# Patient Record
Sex: Male | Born: 1965
Health system: Southern US, Community
[De-identification: ages and names within clinical notes are randomized; demographics above are authoritative.]

## PROBLEM LIST (undated history)

## (undated) DIAGNOSIS — E119 Type 2 diabetes mellitus without complications: Secondary | ICD-10-CM

## (undated) DIAGNOSIS — F32A Depression, unspecified: Secondary | ICD-10-CM

## (undated) DIAGNOSIS — M1990 Unspecified osteoarthritis, unspecified site: Secondary | ICD-10-CM

## (undated) DIAGNOSIS — F419 Anxiety disorder, unspecified: Secondary | ICD-10-CM

## (undated) DIAGNOSIS — M109 Gout, unspecified: Secondary | ICD-10-CM

## (undated) DIAGNOSIS — I10 Essential (primary) hypertension: Secondary | ICD-10-CM

## (undated) DIAGNOSIS — K219 Gastro-esophageal reflux disease without esophagitis: Secondary | ICD-10-CM

## (undated) DIAGNOSIS — F329 Major depressive disorder, single episode, unspecified: Secondary | ICD-10-CM

## (undated) DIAGNOSIS — K5792 Diverticulitis of intestine, part unspecified, without perforation or abscess without bleeding: Secondary | ICD-10-CM

## (undated) DIAGNOSIS — E785 Hyperlipidemia, unspecified: Secondary | ICD-10-CM

## (undated) HISTORY — DX: Essential (primary) hypertension: I10

## (undated) HISTORY — DX: Diverticulitis of intestine, part unspecified, without perforation or abscess without bleeding: K57.92

## (undated) HISTORY — PX: WISDOM TOOTH EXTRACTION: SHX21

## (undated) HISTORY — DX: Anxiety disorder, unspecified: F41.9

## (undated) HISTORY — DX: Gastro-esophageal reflux disease without esophagitis: K21.9

## (undated) HISTORY — DX: Depression, unspecified: F32.A

## (undated) HISTORY — DX: Major depressive disorder, single episode, unspecified: F32.9

## (undated) HISTORY — PX: CHOLECYSTECTOMY: SHX55

## (undated) HISTORY — DX: Hyperlipidemia, unspecified: E78.5

---

## 2010-02-11 DIAGNOSIS — K5792 Diverticulitis of intestine, part unspecified, without perforation or abscess without bleeding: Secondary | ICD-10-CM

## 2010-02-11 HISTORY — DX: Diverticulitis of intestine, part unspecified, without perforation or abscess without bleeding: K57.92

## 2013-02-11 HISTORY — PX: LASIK: SHX215

## 2015-07-06 ENCOUNTER — Encounter: Payer: Self-pay | Admitting: Family Medicine

## 2015-07-06 ENCOUNTER — Ambulatory Visit (INDEPENDENT_AMBULATORY_CARE_PROVIDER_SITE_OTHER): Payer: BLUE CROSS/BLUE SHIELD | Admitting: Family Medicine

## 2015-07-06 ENCOUNTER — Ambulatory Visit (INDEPENDENT_AMBULATORY_CARE_PROVIDER_SITE_OTHER): Payer: BLUE CROSS/BLUE SHIELD

## 2015-07-06 VITALS — BP 140/86 | HR 80 | Temp 98.5°F | Ht 70.0 in | Wt 202.8 lb

## 2015-07-06 DIAGNOSIS — R030 Elevated blood-pressure reading, without diagnosis of hypertension: Secondary | ICD-10-CM | POA: Diagnosis not present

## 2015-07-06 DIAGNOSIS — K219 Gastro-esophageal reflux disease without esophagitis: Secondary | ICD-10-CM | POA: Diagnosis not present

## 2015-07-06 DIAGNOSIS — R079 Chest pain, unspecified: Secondary | ICD-10-CM | POA: Diagnosis not present

## 2015-07-06 DIAGNOSIS — K921 Melena: Secondary | ICD-10-CM

## 2015-07-06 LAB — BAYER DCA HB A1C WAIVED: HB A1C: 5.1 % (ref <7.0)

## 2015-07-06 NOTE — Progress Notes (Addendum)
   HPI  Patient presents today here for chest tightness.  Patient explains that over the last 2 months or so he's had intermittent almost daily left-sided chest tightness. It radiates into his left shoulder and in the left scapula Described as a tightness type pain.  it seems to get worse when he gets more short of breath, he gets more short of breath when he wears a respirator at work.  The pain comes on and lasts hours at a time, it does seem to get worse when he begins to rest. It is not exertional He has no palpitations or leg edema  He works in a [employer].  He is not a smoker, his 2 uncles both had heart attacks, however his father who is 70 years old and his mother has not ever had a heart attack. He has no siblings.  His wife died about 10 months ago from a heart attack.  He also complains of intermittent bloody stools over the last year, these happen a few times a week, however has not happened in the last week. No rectal pain  PMH: Smoking status noted ROS: Per HPI  Objective: BP 161/107 mmHg  Pulse 80  Temp(Src) 98.5 F (36.9 C) (Oral)  Ht '5\' 10"'$  (1.778 m)  Wt 202 lb 12.8 oz (91.989 kg)  BMI 29.10 kg/m2 Gen: NAD, alert, cooperative with exam HEENT: NCAT, nares clear, oropharynx clear CV: RRR, good S1/S2, no murmur Chest wall No tenderness to palpation Resp: CTABL, no wheezes, non-labored Abd: SNTND, BS present, no guarding or organomegaly Ext: No edema, warm Neuro: Alert and oriented, No gross deficits  EKG - NSR CXR - No acute findings  Assessment and plan:  # Chest tightness, chest pain Unlikely cardiac in origin, does have features of typical and atypical chest pain EKG today NSR Chest x-ray normal Labs for risk stratification Likely schedule exercise stress test for clinic   # elevated blood pressure without diagnosis of hypertension Elevated today Came down some after sitting for a while  Gerd Has daily symptoms, distinctly different than  the chest tightness described above Does well with omeprazole     Orders Placed This Encounter  Procedures  . DG Chest 2 View    Standing Status: Future     Number of Occurrences:      Standing Expiration Date: 09/04/2016    Order Specific Question:  Reason for Exam (SYMPTOM  OR DIAGNOSIS REQUIRED)    Answer:  chest pain X 2 months, mild dyspnea    Order Specific Question:  Preferred imaging location?    Answer:  External  . Lipid Panel  . CMP14+EGFR  . CBC with Differential  . TSH  . Bayer DCA Hb A1c Waived  . Ambulatory referral to Gastroenterology    Referral Priority:  Routine    Referral Type:  Consultation    Referral Reason:  Specialty Services Required    Number of Visits Requested:  1  . EKG 12-Lead    Laroy Apple, MD [facility] 07/06/2015, 1:53 PM

## 2015-07-06 NOTE — Patient Instructions (Signed)
Great to meet you!  We will call with lab results and chest x ray results within 1 week  Lets follow up in 1 month, unless you need [place] sooner.     Chest Pain  Chest pain can be caused by many different conditions. There is always a chance that your pain could be related to something serious, such as a heart attack or a blood clot in your lungs. Chest pain can also be caused by conditions that are not life-threatening. If you have chest pain, it is very important to follow up with your health care provider. CAUSES  Chest pain can be caused by:  Heartburn.  Pneumonia or bronchitis.  Anxiety or stress.  Inflammation around your heart (pericarditis) or lung (pleuritis or pleurisy).  A blood clot in your lung.  A collapsed lung (pneumothorax). It can develop suddenly on its own (spontaneous pneumothorax) or from trauma to the chest.  Shingles infection (varicella-zoster virus).  Heart attack.  Damage to the bones, muscles, and cartilage that make up your chest wall. This can include:  Bruised bones due to injury.  Strained muscles or cartilage due to frequent or repeated coughing or overwork.  Fracture to one or more ribs.  Sore cartilage due to inflammation (costochondritis). RISK FACTORS  Risk factors for chest pain may include:  Activities that increase your risk for trauma or injury to your chest.  Respiratory infections or conditions that cause frequent coughing.  Medical conditions or overeating that can cause heartburn.  Heart disease or family history of heart disease.  Conditions or health behaviors that increase your risk of developing a blood clot.  Having had chicken pox (varicella zoster). SIGNS AND SYMPTOMS Chest pain can feel like:  Burning or tingling on the surface of your chest or deep in your chest.  Crushing, pressure, aching, or squeezing pain.  Dull or sharp pain that is worse when you move, cough, or take a deep breath.  Pain that is also  felt in your back, neck, shoulder, or arm, or pain that spreads to any of these areas. Your chest pain may come and go, or it may stay constant. DIAGNOSIS Lab tests or other studies may be needed to find the cause of your pain. Your health care provider may have you take a test called an ambulatory ECG (electrocardiogram). An ECG records your heartbeat patterns at the time the test is performed. You may also have other tests, such as:  Transthoracic echocardiogram (TTE). During echocardiography, sound waves are used to create a picture of all of the heart structures and to look at how blood flows through your heart.  Transesophageal echocardiogram (TEE).This is a more advanced imaging test that obtains images from inside your body. It allows your health care provider to see your heart in finer detail.  Cardiac monitoring. This allows your health care provider to monitor your heart rate and rhythm in real time.  Holter monitor. This is a portable device that records your heartbeat and can help to diagnose abnormal heartbeats. It allows your health care provider to track your heart activity for several days, if needed.  Stress tests. These can be done through exercise or by taking medicine that makes your heart beat more quickly.  Blood tests.  Imaging tests. TREATMENT  Your treatment depends on what is causing your chest pain. Treatment may include:  Medicines. These may include:  Acid blockers for heartburn.  Anti-inflammatory medicine.  Pain medicine for inflammatory conditions.  Antibiotic medicine, if an  infection is present.  Medicines to dissolve blood clots.  Medicines to treat coronary artery disease.  Supportive care for conditions that do not require medicines. This may include:  Resting.  Applying heat or cold packs to injured areas.  Limiting activities until pain decreases. HOME CARE INSTRUCTIONS  If you were prescribed an antibiotic medicine, finish it all  even if you start to feel better.  Avoid any activities that bring on chest pain.  Do not use any tobacco products, including cigarettes, chewing tobacco, or electronic cigarettes. If you need help quitting, ask your health care provider.  Do not drink alcohol.  Take medicines only as directed by your health care provider.  Keep all follow-up visits as directed by your health care provider. This is important. This includes any further testing if your chest pain does not go away.  If heartburn is the cause for your chest pain, you may be told to keep your head raised (elevated) while sleeping. This reduces the chance that acid will go from your stomach into your esophagus.  Make lifestyle changes as directed by your health care provider. These may include:  Getting regular exercise. Ask your health care provider to suggest some activities that are safe for you.  Eating a heart-healthy diet. A registered dietitian can help you to learn healthy eating options.  Maintaining a healthy weight.  Managing diabetes, if necessary.  Reducing stress. SEEK MEDICAL CARE IF:  Your chest pain does not go away after treatment.  You have a rash with blisters on your chest.  You have a fever. SEEK IMMEDIATE MEDICAL CARE IF:   Your chest pain is worse.  You have an increasing cough, or you cough up blood.  You have severe abdominal pain.  You have severe weakness.  You faint.  You have chills.  You have sudden, unexplained chest discomfort.  You have sudden, unexplained discomfort in your arms, back, neck, or jaw.  You have shortness of breath at any time.  You suddenly start to sweat, or your skin gets clammy.  You feel nauseous or you vomit.  You suddenly feel light-headed or dizzy.  Your heart begins to beat quickly, or it feels like it is skipping beats. These symptoms may represent a serious problem that is an emergency. Do not wait to see if the symptoms will go away. Get  medical help right away. Call your local emergency services (911 in the [place] Do not drive yourself to the hospital.   This information is not intended to replace advice given to you by your health care provider. Make sure you discuss any questions you have with your health care provider.   Document Released: 11/07/2004 Document Revised: 02/18/2014 Document Reviewed: 09/03/2013 Elsevier Interactive Patient Education [employer].

## 2015-07-07 LAB — CMP14+EGFR
ALT: 25 IU/L (ref 0–44)
AST: 23 IU/L (ref 0–40)
Albumin/Globulin Ratio: 1.6 (ref 1.2–2.2)
Albumin: 4.6 g/dL (ref 3.5–5.5)
Alkaline Phosphatase: 90 IU/L (ref 39–117)
BUN/Creatinine Ratio: 17 (ref 9–20)
BUN: 19 mg/dL (ref 6–24)
Bilirubin Total: 0.5 mg/dL (ref 0.0–1.2)
CALCIUM: 9.7 mg/dL (ref 8.7–10.2)
CO2: 22 mmol/L (ref 18–29)
CREATININE: 1.1 mg/dL (ref 0.76–1.27)
Chloride: 98 mmol/L (ref 96–106)
GFR calc Af Amer: 89 mL/min/{1.73_m2} (ref >59)
GFR, EST NON AFRICAN AMERICAN: 77 mL/min/{1.73_m2} (ref >59)
GLOBULIN, TOTAL: 2.8 g/dL (ref 1.5–4.5)
Glucose: 137 mg/dL — ABNORMAL HIGH (ref 65–99)
Potassium: 4.2 mmol/L (ref 3.5–5.2)
Sodium: 137 mmol/L (ref 134–144)
TOTAL PROTEIN: 7.4 g/dL (ref 6.0–8.5)

## 2015-07-07 LAB — CBC WITH DIFFERENTIAL/PLATELET
BASOS: 0 %
Basophils Absolute: 0 10*3/uL (ref 0.0–0.2)
EOS (ABSOLUTE): 0.2 10*3/uL (ref 0.0–0.4)
EOS: 2 %
HEMATOCRIT: 46.1 % (ref 37.5–51.0)
HEMOGLOBIN: 15.6 g/dL (ref 12.6–17.7)
IMMATURE GRANS (ABS): 0 10*3/uL (ref 0.0–0.1)
IMMATURE GRANULOCYTES: 0 %
LYMPHS: 23 %
Lymphocytes Absolute: 1.8 10*3/uL (ref 0.7–3.1)
MCH: 32.3 pg (ref 26.6–33.0)
MCHC: 33.8 g/dL (ref 31.5–35.7)
MCV: 95 fL (ref 79–97)
MONOCYTES: 8 %
Monocytes Absolute: 0.6 10*3/uL (ref 0.1–0.9)
NEUTROS PCT: 67 %
Neutrophils Absolute: 5.2 10*3/uL (ref 1.4–7.0)
Platelets: 217 10*3/uL (ref 150–379)
RBC: 4.83 x10E6/uL (ref 4.14–5.80)
RDW: 13.2 % (ref 12.3–15.4)
WBC: 7.8 10*3/uL (ref 3.4–10.8)

## 2015-07-07 LAB — TSH: TSH: 1.64 u[IU]/mL (ref 0.450–4.500)

## 2015-07-07 LAB — LIPID PANEL
CHOL/HDL RATIO: 3.6 ratio (ref 0.0–5.0)
CHOLESTEROL TOTAL: 187 mg/dL (ref 100–199)
HDL: 52 mg/dL (ref >39)
LDL CALC: 84 mg/dL (ref 0–99)
Triglycerides: 253 mg/dL — ABNORMAL HIGH (ref 0–149)
VLDL Cholesterol Cal: 51 mg/dL — ABNORMAL HIGH (ref 5–40)

## 2015-07-11 ENCOUNTER — Ambulatory Visit (INDEPENDENT_AMBULATORY_CARE_PROVIDER_SITE_OTHER): Payer: BLUE CROSS/BLUE SHIELD | Admitting: Family

## 2015-07-11 ENCOUNTER — Ambulatory Visit: Payer: BLUE CROSS/BLUE SHIELD | Admitting: Nurse Practitioner

## 2015-07-11 ENCOUNTER — Encounter: Payer: Self-pay | Admitting: Family

## 2015-07-11 VITALS — BP 169/112 | HR 78 | Temp 98.2°F | Ht 70.0 in | Wt 198.6 lb

## 2015-07-11 DIAGNOSIS — E1159 Type 2 diabetes mellitus with other circulatory complications: Secondary | ICD-10-CM | POA: Insufficient documentation

## 2015-07-11 DIAGNOSIS — I10 Essential (primary) hypertension: Secondary | ICD-10-CM | POA: Diagnosis not present

## 2015-07-11 DIAGNOSIS — E663 Overweight: Secondary | ICD-10-CM | POA: Diagnosis not present

## 2015-07-11 MED ORDER — LISINOPRIL-HYDROCHLOROTHIAZIDE 20-12.5 MG PO TABS: 1.0000 | ORAL_TABLET | Freq: Every day | ORAL | Status: DC

## 2015-07-11 NOTE — Patient Instructions (Signed)
DASH Eating Plan °DASH stands for "Dietary Approaches to Stop Hypertension." The DASH eating plan is a healthy eating plan that has been shown to reduce high blood pressure (hypertension). Additional health benefits may include reducing the risk of type 2 diabetes mellitus, heart disease, and stroke. The DASH eating plan may also help with weight loss. °WHAT DO I NEED TO KNOW ABOUT THE DASH EATING PLAN? °For the DASH eating plan, you will follow these general guidelines: °· Choose foods with a percent daily value for sodium of less than 5% (as listed on the food label). °· Use salt-free seasonings or herbs instead of table salt or sea salt. °· Check with your health care provider or pharmacist before using salt substitutes. °· Eat lower-sodium products, often labeled as "lower sodium" or "no salt added." °· Eat fresh foods. °· Eat more vegetables, fruits, and low-fat dairy products. °· Choose whole grains. Look for the word "whole" as the first word in the ingredient list. °· Choose fish and skinless chicken or turkey more often than red meat. Limit fish, poultry, and meat to 6 oz (170 g) each day. °· Limit sweets, desserts, sugars, and sugary drinks. °· Choose heart-healthy fats. °· Limit cheese to 1 oz (28 g) per day. °· Eat more home-cooked food and less restaurant, buffet, and fast food. °· Limit fried foods. °· Cook foods using methods other than frying. °· Limit canned vegetables. If you do use them, rinse them well to decrease the sodium. °· When eating at a restaurant, ask that your food be prepared with less salt, or no salt if possible. °WHAT FOODS CAN I EAT? °Seek help from a dietitian for individual calorie needs. °Grains °Whole grain or whole wheat bread. Brown rice. Whole grain or whole wheat pasta. Quinoa, bulgur, and whole grain cereals. Low-sodium cereals. Corn or whole wheat flour tortillas. Whole grain cornbread. Whole grain crackers. Low-sodium crackers. °Vegetables °Fresh or frozen vegetables  (raw, steamed, roasted, or grilled). Low-sodium or reduced-sodium tomato and vegetable juices. Low-sodium or reduced-sodium tomato sauce and paste. Low-sodium or reduced-sodium canned vegetables.  °Fruits °All fresh, canned (in natural juice), or frozen fruits. °Meat and Other Protein Products °Ground beef (85% or leaner), grass-fed beef, or beef trimmed of fat. Skinless chicken or turkey. Ground chicken or turkey. Pork trimmed of fat. All fish and seafood. Eggs. Dried beans, peas, or lentils. Unsalted nuts and seeds. Unsalted canned beans. °Dairy °Low-fat dairy products, such as skim or 1% milk, 2% or reduced-fat cheeses, low-fat ricotta or cottage cheese, or plain low-fat yogurt. Low-sodium or reduced-sodium cheeses. °Fats and Oils °Tub margarines without trans fats. Light or reduced-fat mayonnaise and salad dressings (reduced sodium). Avocado. Safflower, olive, or canola oils. Natural peanut or almond butter. °Other °Unsalted popcorn and pretzels. °The items listed above may not be a complete list of recommended foods or beverages. Contact your dietitian for more options. °WHAT FOODS ARE NOT RECOMMENDED? °Grains °White bread. White pasta. White rice. Refined cornbread. Bagels and croissants. Crackers that contain trans fat. °Vegetables °Creamed or fried vegetables. Vegetables in a cheese sauce. Regular canned vegetables. Regular canned tomato sauce and paste. Regular tomato and vegetable juices. °Fruits °Dried fruits. Canned fruit in light or heavy syrup. Fruit juice. °Meat and Other Protein Products °Fatty cuts of meat. Ribs, chicken wings, bacon, sausage, bologna, salami, chitterlings, fatback, hot dogs, bratwurst, and packaged luncheon meats. Salted nuts and seeds. Canned beans with salt. °Dairy °Whole or 2% milk, cream, half-and-half, and cream cheese. Whole-fat or sweetened yogurt. Full-fat   cheeses or blue cheese. Nondairy creamers and whipped toppings. Processed cheese, cheese spreads, or cheese  curds. °Condiments °Onion and garlic salt, seasoned salt, table salt, and sea salt. Canned and packaged gravies. Worcestershire sauce. Tartar sauce. Barbecue sauce. Teriyaki sauce. Soy sauce, including reduced sodium. Steak sauce. Fish sauce. Oyster sauce. Cocktail sauce. Horseradish. Ketchup and mustard. Meat flavorings and tenderizers. Bouillon cubes. Hot sauce. Tabasco sauce. Marinades. Taco seasonings. Relishes. °Fats and Oils °Butter, stick margarine, lard, shortening, ghee, and bacon fat. Coconut, palm kernel, or palm oils. Regular salad dressings. °Other °Pickles and olives. Salted popcorn and pretzels. °The items listed above may not be a complete list of foods and beverages to avoid. Contact your dietitian for more information. °WHERE CAN I FIND MORE INFORMATION? °National Heart, [employer], [facility] [employer] Institute: www.nhlbi.nih.gov/health/health-topics/topics/dash/ °  °This information is not intended to replace advice given to you by your health care provider. Make sure you discuss any questions you have with your health care provider. °  °Document Released: 01/17/2011 Document Revised: 02/18/2014 Document Reviewed: 12/02/2012 °Elsevier Interactive Patient Education ©2016 Elsevier Inc. ° °Hypertension °Hypertension, commonly called high blood pressure, is when the force of blood pumping through your arteries is too strong. Your arteries are the blood vessels that carry blood from your heart throughout your body. A blood pressure reading consists of a higher number over a lower number, such as 110/72. The higher number (systolic) is the pressure inside your arteries when your heart pumps. The lower number (diastolic) is the pressure inside your arteries when your heart relaxes. Ideally you want your blood pressure below 120/80. °Hypertension forces your heart to work harder to pump blood. Your arteries may become narrow or stiff. Having untreated or uncontrolled hypertension can cause heart attack, stroke, kidney  disease, and other problems. °RISK FACTORS °Some risk factors for high blood pressure are controllable. Others are not.  °Risk factors you cannot control include:  °· Race. You may be at higher risk if you are African American. °· Age. Risk increases with age. °· Gender. Men are at higher risk than women before age 45 years. After age 65, women are at higher risk than men. °Risk factors you can control include: °· Not getting enough exercise or physical activity. °· Being overweight. °· Getting too much fat, sugar, calories, or salt in your diet. °· Drinking too much alcohol. °SIGNS AND SYMPTOMS °Hypertension does not usually cause signs or symptoms. Extremely high blood pressure (hypertensive crisis) may cause headache, anxiety, shortness of breath, and nosebleed. °DIAGNOSIS °To check if you have hypertension, your health care provider will measure your blood pressure while you are seated, with your arm held at the level of your heart. It should be measured at least twice using the same arm. Certain conditions can cause a difference in blood pressure between your right and left arms. A blood pressure reading that is higher than normal on one occasion does not mean that you need treatment. If it is not clear whether you have high blood pressure, you may be asked to return on a different day to have your blood pressure checked again. Or, you may be asked to monitor your blood pressure at home for 1 or more weeks. °TREATMENT °Treating high blood pressure includes making lifestyle changes and possibly taking medicine. Living a healthy lifestyle can help lower high blood pressure. You may need to change some of your habits. °Lifestyle changes may include: °· Following the DASH diet. This diet is high in fruits, vegetables, and whole   grains. It is low in salt, red meat, and added sugars. °· Keep your sodium intake below 2,300 mg per day. °· Getting at least 30-45 minutes of aerobic exercise at least 4 times per  week. °· Losing weight if necessary. °· Not smoking. °· Limiting alcoholic beverages. °· Learning ways to reduce stress. °Your health care provider may prescribe medicine if lifestyle changes are not enough to get your blood pressure under control, and if one of the following is true: °· You are 18-59 years of age and your systolic blood pressure is above 140. °· You are 60 years of age or older, and your systolic blood pressure is above 150. °· Your diastolic blood pressure is above 90. °· You have diabetes, and your systolic blood pressure is over 140 or your diastolic blood pressure is over 90. °· You have kidney disease and your blood pressure is above 140/90. °· You have heart disease and your blood pressure is above 140/90. °Your personal target blood pressure may vary depending on your medical conditions, your age, and other factors. °HOME CARE INSTRUCTIONS °· Have your blood pressure rechecked as directed by your health care provider.   °· Take medicines only as directed by your health care provider. Follow the directions carefully. Blood pressure medicines must be taken as prescribed. The medicine does not work as well when you skip doses. Skipping doses also puts you at risk for problems. °· Do not smoke.   °· Monitor your blood pressure at home as directed by your health care provider.  °SEEK MEDICAL CARE IF:  °· You think you are having a reaction to medicines taken. °· You have recurrent headaches or feel dizzy. °· You have swelling in your ankles. °· You have trouble with your vision. °SEEK IMMEDIATE MEDICAL CARE IF: °· You develop a severe headache or confusion. °· You have unusual weakness, numbness, or feel faint. °· You have severe chest or abdominal pain. °· You vomit repeatedly. °· You have trouble breathing. °MAKE SURE YOU:  °· Understand these instructions. °· Will watch your condition. °· Will get help right away if you are not doing well or get worse. °  °This information is not intended to  replace advice given to you by your health care provider. Make sure you discuss any questions you have with your health care provider. °  °Document Released: 01/28/2005 Document Revised: 06/14/2014 Document Reviewed: 11/20/2012 °Elsevier Interactive Patient Education ©2016 Elsevier Inc. ° °

## 2015-07-11 NOTE — Progress Notes (Signed)
   Subjective:    Patient ID: Alejandro Rivera, male    DOB: 1965/10/06, 50 y.o.   MRN: [id number]  HPI    Review of Systems     Objective:   Physical Exam        Assessment & Plan:

## 2015-07-11 NOTE — Progress Notes (Signed)
   Subjective:    Patient ID: Alejandro Rivera, male    DOB: 07-08-1965, 50 y.o.   MRN: [id number]  Pt presents to the office for recurrent HTN and chest pain. Pt was seen on 07/06/15 and had negative EKG, chest x-ray and stable lab work.  Hypertension This is a new problem. The current episode started more than 1 year ago. The problem has been gradually worsening since onset. The problem is uncontrolled. Associated symptoms include chest pain and palpitations. Pertinent negatives include no anxiety, blurred vision, headaches, peripheral edema, shortness of breath or sweats. Risk factors for coronary artery disease include family history. Past treatments include nothing. The current treatment provides no improvement. There is no history of kidney disease, CAD/MI, CVA, heart failure or a thyroid problem. There is no history of sleep apnea.  Chest Pain  Associated symptoms include palpitations. Pertinent negatives include no headaches or shortness of breath.  His past medical history is significant for hypertension.  Pertinent negatives for past medical history include no sleep apnea and no thyroid problem.      Review of Systems  Eyes: Negative for blurred vision.  Respiratory: Negative for shortness of breath.   Cardiovascular: Positive for chest pain and palpitations.  Neurological: Negative for headaches.  All other systems reviewed and are negative.      Objective:   Physical Exam  Constitutional: He is oriented to person, place, and time. He appears well-developed and well-nourished. No distress.  HENT:  Head: Normocephalic.  Eyes: Pupils are equal, round, and reactive to light. Right eye exhibits no discharge. Left eye exhibits no discharge.  Neck: Normal range of motion. Neck supple. No thyromegaly present.  Cardiovascular: Normal rate, regular rhythm, normal heart sounds and intact distal pulses.   No murmur heard. Pulmonary/Chest: Effort normal and breath sounds normal. No  respiratory distress. He has no wheezes.  Abdominal: Soft. Bowel sounds are normal. He exhibits no distension. There is no tenderness.  Musculoskeletal: Normal range of motion. He exhibits no edema or tenderness.  Neurological: He is alert and oriented to person, place, and time. He has normal reflexes. No cranial nerve deficit.  Skin: Skin is warm and dry. No rash noted. No erythema.  Psychiatric: He has a normal mood and affect. His behavior is normal. Judgment and thought content normal.  Vitals reviewed.   BP 169/112 mmHg  Pulse 78  Temp(Src) 98.2 F (36.8 C) (Oral)  Ht 5\' 10"  (1.778 m)  Wt 198 lb 9.6 oz (90.084 kg)  BMI 28.50 kg/m2       Assessment & Plan:  1. Essential hypertension -PT started on Zestoretic 20-12.5 mg today -Daily blood pressure log given with instructions on how to fill out and told to bring to next visit -Dash diet information given -Exercise encouraged - Stress Management  -Continue current meds -RTO in 1 week - lisinopril-hydrochlorothiazide (ZESTORETIC) 20-12.5 MG tablet; Take 1 tablet by mouth daily.  Dispense: 90 tablet; Refill: 3  2. Overweight (BMI 25.0-29.9)   Evelina Dun, FNP

## 2015-07-18 ENCOUNTER — Encounter: Payer: Self-pay | Admitting: Family

## 2015-07-18 ENCOUNTER — Ambulatory Visit (INDEPENDENT_AMBULATORY_CARE_PROVIDER_SITE_OTHER): Payer: BLUE CROSS/BLUE SHIELD | Admitting: Family

## 2015-07-18 VITALS — BP 127/86 | HR 66 | Temp 98.0°F | Ht 70.0 in | Wt 202.8 lb

## 2015-07-18 DIAGNOSIS — I10 Essential (primary) hypertension: Secondary | ICD-10-CM | POA: Diagnosis not present

## 2015-07-18 DIAGNOSIS — Z114 Encounter for screening for human immunodeficiency virus [HIV]: Secondary | ICD-10-CM | POA: Diagnosis not present

## 2015-07-18 NOTE — Progress Notes (Signed)
   Subjective:    Patient ID: Alejandro Rivera, male    DOB: [birth year], 50 y.o.   MRN: [id number]  Pt presents to the office today to recheck HTN. PT's BP is not at goal today. Hypertension This is a chronic problem. The current episode started more than 1 year ago. The problem has been resolved since onset. The problem is controlled. Associated symptoms include anxiety. Pertinent negatives include no headaches, palpitations, peripheral edema or shortness of breath. Risk factors for coronary artery disease include family history, obesity, male gender and stress. Past treatments include diuretics and ACE inhibitors. The current treatment provides moderate improvement. There is no history of kidney disease, CAD/MI, CVA, heart failure or a thyroid problem. There is no history of sleep apnea.      Review of Systems  Constitutional: Negative.   HENT: Negative.   Respiratory: Negative.  Negative for shortness of breath.   Cardiovascular: Negative.  Negative for palpitations.  Gastrointestinal: Negative.   Endocrine: Negative.   Genitourinary: Negative.   Musculoskeletal: Negative.   Neurological: Negative.  Negative for headaches.  Hematological: Negative.   Psychiatric/Behavioral: Negative.   All other systems reviewed and are negative.      Objective:   Physical Exam  Constitutional: He is oriented to person, place, and time. He appears well-developed and well-nourished. No distress.  HENT:  Head: Normocephalic.  Right Ear: External ear normal.  Left Ear: External ear normal.  Nose: Nose normal.  Mouth/Throat: Oropharynx is clear and moist.  Eyes: Pupils are equal, round, and reactive to light. Right eye exhibits no discharge. Left eye exhibits no discharge.  Neck: Normal range of motion. Neck supple. No thyromegaly present.  Cardiovascular: Normal rate, regular rhythm, normal heart sounds and intact distal pulses.   No murmur heard. Pulmonary/Chest: Effort normal and breath sounds  normal. No respiratory distress. He has no wheezes.  Abdominal: Soft. Bowel sounds are normal. He exhibits no distension. There is no tenderness.  Musculoskeletal: Normal range of motion. He exhibits no edema or tenderness.  Neurological: He is alert and oriented to person, place, and time. He has normal reflexes. No cranial nerve deficit.  Skin: Skin is warm and dry. No rash noted. No erythema.  Psychiatric: He has a normal mood and affect. His behavior is normal. Judgment and thought content normal.  Vitals reviewed.   BP 127/86 mmHg  Pulse 66  Temp(Src) 98 F (36.7 C) (Oral)  Ht '5\' 10"'$  (1.778 m)  Wt 202 lb 12.8 oz (91.989 kg)  BMI 29.10 kg/m2      Assessment & Plan:  1. Essential hypertension -Dash diet information given -Exercise encouraged - Stress Management  -Continue current meds - BMP8+EGFR  2. Screening for HIV (human immunodeficiency virus) - BMP8+EGFR - HIV antibody   Continue all meds Labs pending Health Maintenance reviewed Diet and exercise encouraged RTO as needed and keep appt with PCP  Evelina Dun, FNP

## 2015-07-18 NOTE — Patient Instructions (Signed)
Hypertension Hypertension, commonly called high blood pressure, is when the force of blood pumping through your arteries is too strong. Your arteries are the blood vessels that carry blood from your heart throughout your body. A blood pressure reading consists of a higher number over a lower number, such as 110/72. The higher number (systolic) is the pressure inside your arteries when your heart pumps. The lower number (diastolic) is the pressure inside your arteries when your heart relaxes. Ideally you want your blood pressure below 120/80. Hypertension forces your heart to work harder to pump blood. Your arteries may become narrow or stiff. Having untreated or uncontrolled hypertension can cause heart attack, stroke, kidney disease, and other problems. RISK FACTORS Some risk factors for high blood pressure are controllable. Others are not.  Risk factors you cannot control include:   Race. You may be at higher risk if you are African American.  Age. Risk increases with age.  Gender. Men are at higher risk than women before age 45 years. After age 65, women are at higher risk than men. Risk factors you can control include:  Not getting enough exercise or physical activity.  Being overweight.  Getting too much fat, sugar, calories, or salt in your diet.  Drinking too much alcohol. SIGNS AND SYMPTOMS Hypertension does not usually cause signs or symptoms. Extremely high blood pressure (hypertensive crisis) may cause headache, anxiety, shortness of breath, and nosebleed. DIAGNOSIS To check if you have hypertension, your health care provider will measure your blood pressure while you are seated, with your arm held at the level of your heart. It should be measured at least twice using the same arm. Certain conditions can cause a difference in blood pressure between your right and left arms. A blood pressure reading that is higher than normal on one occasion does not mean that you need treatment. If  it is not clear whether you have high blood pressure, you may be asked to return on a different day to have your blood pressure checked again. Or, you may be asked to monitor your blood pressure at home for 1 or more weeks. TREATMENT Treating high blood pressure includes making lifestyle changes and possibly taking medicine. Living a healthy lifestyle can help lower high blood pressure. You may need to change some of your habits. Lifestyle changes may include:  Following the DASH diet. This diet is high in fruits, vegetables, and whole grains. It is low in salt, red meat, and added sugars.  Keep your sodium intake below 2,300 mg per day.  Getting at least 30-45 minutes of aerobic exercise at least 4 times per week.  Losing weight if necessary.  Not smoking.  Limiting alcoholic beverages.  Learning ways to reduce stress. Your health care provider may prescribe medicine if lifestyle changes are not enough to get your blood pressure under control, and if one of the following is true:  You are 18-59 years of age and your systolic blood pressure is above 140.  You are 60 years of age or older, and your systolic blood pressure is above 150.  Your diastolic blood pressure is above 90.  You have diabetes, and your systolic blood pressure is over 140 or your diastolic blood pressure is over 90.  You have kidney disease and your blood pressure is above 140/90.  You have heart disease and your blood pressure is above 140/90. Your personal target blood pressure may vary depending on your medical conditions, your age, and other factors. HOME CARE INSTRUCTIONS    Have your blood pressure rechecked as directed by your health care provider.   Take medicines only as directed by your health care provider. Follow the directions carefully. Blood pressure medicines must be taken as prescribed. The medicine does not work as well when you skip doses. Skipping doses also puts you at risk for  problems.  Do not smoke.   Monitor your blood pressure at home as directed by your health care provider. SEEK MEDICAL CARE IF:   You think you are having a reaction to medicines taken.  You have recurrent headaches or feel dizzy.  You have swelling in your ankles.  You have trouble with your vision. SEEK IMMEDIATE MEDICAL CARE IF:  You develop a severe headache or confusion.  You have unusual weakness, numbness, or feel faint.  You have severe chest or abdominal pain.  You vomit repeatedly.  You have trouble breathing. MAKE SURE YOU:   Understand these instructions.  Will watch your condition.  Will get help right away if you are not doing well or get worse.   This information is not intended to replace advice given to you by your health care provider. Make sure you discuss any questions you have with your health care provider.   Document Released: 01/28/2005 Document Revised: 06/14/2014 Document Reviewed: 11/20/2012 Elsevier Interactive Patient Education 2016 Elsevier Inc.  

## 2015-07-19 LAB — HIV ANTIBODY (ROUTINE TESTING W REFLEX): HIV SCREEN 4TH GENERATION: NONREACTIVE

## 2015-07-19 LAB — BMP8+EGFR
BUN/Creatinine Ratio: 19 (ref 9–20)
BUN: 16 mg/dL (ref 6–24)
CALCIUM: 10.1 mg/dL (ref 8.7–10.2)
CO2: 25 mmol/L (ref 18–29)
CREATININE: 0.86 mg/dL (ref 0.76–1.27)
Chloride: 97 mmol/L (ref 96–106)
GFR calc Af Amer: 116 mL/min/{1.73_m2} (ref >59)
GFR, EST NON AFRICAN AMERICAN: 100 mL/min/{1.73_m2} (ref >59)
GLUCOSE: 90 mg/dL (ref 65–99)
Potassium: 4.2 mmol/L (ref 3.5–5.2)
Sodium: 139 mmol/L (ref 134–144)

## 2015-08-08 ENCOUNTER — Ambulatory Visit: Payer: BLUE CROSS/BLUE SHIELD | Admitting: Family Medicine

## 2015-08-18 ENCOUNTER — Ambulatory Visit (INDEPENDENT_AMBULATORY_CARE_PROVIDER_SITE_OTHER): Payer: BLUE CROSS/BLUE SHIELD | Admitting: Family Medicine

## 2015-08-18 ENCOUNTER — Encounter: Payer: Self-pay | Admitting: Family Medicine

## 2015-08-18 VITALS — BP 134/83 | HR 81 | Temp 98.3°F | Ht 70.0 in | Wt 203.2 lb

## 2015-08-18 DIAGNOSIS — I10 Essential (primary) hypertension: Secondary | ICD-10-CM | POA: Diagnosis not present

## 2015-08-18 DIAGNOSIS — R0789 Other chest pain: Secondary | ICD-10-CM | POA: Diagnosis not present

## 2015-08-18 DIAGNOSIS — F411 Generalized anxiety disorder: Secondary | ICD-10-CM

## 2015-08-18 MED ORDER — CITALOPRAM HYDROBROMIDE 20 MG PO TABS: 20.0000 mg | ORAL_TABLET | Freq: Every day | ORAL | Status: DC

## 2015-08-18 NOTE — Progress Notes (Signed)
   HPI  Patient presents today here for follow up of chest pain and HTN, also to discuss anxiety  Pt states that his chest pain is L sided tightness type pain lasting 4 to 30 minutes happening a few times a day. It radiates to the L shoulder blade.   It happens at rest and with exertion, it is not associated with sweating, dyspnea, palps, or faint feeling.   He also has some feeling of not being able to catch his breath in th emiddle of the night, he does not have orthopnea.    Anxiety Has been going on since his wifes death, his girlfriend is here and states that he is only sleeps a few hours a night and is under much more stress and more anxious than he realizes.   HTN Measures 130-140 most of the time at home  no side effects form meds Feels better overall  PMH: Smoking status noted ROS: Per HPI  Objective: BP 134/83 mmHg  Pulse 81  Temp(Src) 98.3 F (36.8 C) (Oral)  Ht 5\' 10"  (1.778 m)  Wt 203 lb 3.2 oz (92.171 kg)  BMI 29.16 kg/m2 Gen: NAD, alert, cooperative with exam HEENT: NCAT CV: RRR, good S1/S2, no murmur Chest wall- no tenderness to palp Resp: CTABL, no wheezes, non-labored Ext: No edema, warm Neuro: Alert and oriented, No gross deficits  Assessment and plan:  # atypical chest pain Features of typical and atypical Labs are low risk, EKG last visit reassuring After discussion I think anxiety is most likely culprit  With PND consider TTE  # Anxiety New problem Start celexa 10 mg X 2 weeks then 20 mg.  Denies SI and depression   # HTN Well controlled, Labs repeated and Cre stable.  Continue prinzide   Orders Placed This Encounter  Procedures  . Ambulatory referral to Cardiology    Referral Priority:  Routine    Referral Type:  Consultation    Referral Reason:  Specialty Services Required    Requested Specialty:  Cardiology    Number of Visits Requested:  1    Meds ordered this encounter  Medications  . citalopram (CELEXA) 20 MG tablet    Sig: Take 1 tablet (20 mg total) by mouth daily.    Dispense:  30 tablet    Refill:  [place], MD [facility] 08/18/2015, 3:36 PM

## 2015-08-18 NOTE — Patient Instructions (Addendum)
Great to see you!  I am starting you on citalopram for anxiety and stress, we are working on a cardiology referral as well  Please start with 1/2 pill daily for 2 weeks then go up to 1 whole pill daily  Come back in 4 weeks for follow up  Nonspecific Chest Pain  Chest pain can be caused by many different conditions. There is always a chance that your pain could be related to something serious, such as a heart attack or a blood clot in your lungs. Chest pain can also be caused by conditions that are not life-threatening. If you have chest pain, it is very important to follow up with your health care provider. CAUSES  Chest pain can be caused by:  Heartburn.  Pneumonia or bronchitis.  Anxiety or stress.  Inflammation around your heart (pericarditis) or lung (pleuritis or pleurisy).  A blood clot in your lung.  A collapsed lung (pneumothorax). It can develop suddenly on its own (spontaneous pneumothorax) or from trauma to the chest.  Shingles infection (varicella-zoster virus).  Heart attack.  Damage to the bones, muscles, and cartilage that make up your chest wall. This can include:  Bruised bones due to injury.  Strained muscles or cartilage due to frequent or repeated coughing or overwork.  Fracture to one or more ribs.  Sore cartilage due to inflammation (costochondritis). RISK FACTORS  Risk factors for chest pain may include:  Activities that increase your risk for trauma or injury to your chest.  Respiratory infections or conditions that cause frequent coughing.  Medical conditions or overeating that can cause heartburn.  Heart disease or family history of heart disease.  Conditions or health behaviors that increase your risk of developing a blood clot.  Having had chicken pox (varicella zoster). SIGNS AND SYMPTOMS Chest pain can feel like:  Burning or tingling on the surface of your chest or deep in your chest.  Crushing, pressure, aching, or squeezing  pain.  Dull or sharp pain that is worse when you move, cough, or take a deep breath.  Pain that is also felt in your back, neck, shoulder, or arm, or pain that spreads to any of these areas. Your chest pain may come and go, or it may stay constant. DIAGNOSIS Lab tests or other studies may be needed to find the cause of your pain. Your health care provider may have you take a test called an ambulatory ECG (electrocardiogram). An ECG records your heartbeat patterns at the time the test is performed. You may also have other tests, such as:  Transthoracic echocardiogram (TTE). During echocardiography, sound waves are used to create a picture of all of the heart structures and to look at how blood flows through your heart.  Transesophageal echocardiogram (TEE).This is a more advanced imaging test that obtains images from inside your body. It allows your health care provider to see your heart in finer detail.  Cardiac monitoring. This allows your health care provider to monitor your heart rate and rhythm in real time.  Holter monitor. This is a portable device that records your heartbeat and can help to diagnose abnormal heartbeats. It allows your health care provider to track your heart activity for several days, if needed.  Stress tests. These can be done through exercise or by taking medicine that makes your heart beat more quickly.  Blood tests.  Imaging tests. TREATMENT  Your treatment depends on what is causing your chest pain. Treatment may include:  Medicines. These may include:  Acid blockers for heartburn.  Anti-inflammatory medicine.  Pain medicine for inflammatory conditions.  Antibiotic medicine, if an infection is present.  Medicines to dissolve blood clots.  Medicines to treat coronary artery disease.  Supportive care for conditions that do not require medicines. This may include:  Resting.  Applying heat or cold packs to injured areas.  Limiting activities  until pain decreases. HOME CARE INSTRUCTIONS  If you were prescribed an antibiotic medicine, finish it all even if you start to feel better.  Avoid any activities that bring on chest pain.  Do not use any tobacco products, including cigarettes, chewing tobacco, or electronic cigarettes. If you need help quitting, ask your health care provider.  Do not drink alcohol.  Take medicines only as directed by your health care provider.  Keep all follow-up visits as directed by your health care provider. This is important. This includes any further testing if your chest pain does not go away.  If heartburn is the cause for your chest pain, you may be told to keep your head raised (elevated) while sleeping. This reduces the chance that acid will go from your stomach into your esophagus.  Make lifestyle changes as directed by your health care provider. These may include:  Getting regular exercise. Ask your health care provider to suggest some activities that are safe for you.  Eating a heart-healthy diet. A registered dietitian can help you to learn healthy eating options.  Maintaining a healthy weight.  Managing diabetes, if necessary.  Reducing stress. SEEK MEDICAL CARE IF:  Your chest pain does not go away after treatment.  You have a rash with blisters on your chest.  You have a fever. SEEK IMMEDIATE MEDICAL CARE IF:   Your chest pain is worse.  You have an increasing cough, or you cough up blood.  You have severe abdominal pain.  You have severe weakness.  You faint.  You have chills.  You have sudden, unexplained chest discomfort.  You have sudden, unexplained discomfort in your arms, back, neck, or jaw.  You have shortness of breath at any time.  You suddenly start to sweat, or your skin gets clammy.  You feel nauseous or you vomit.  You suddenly feel light-headed or dizzy.  Your heart begins to beat quickly, or it feels like it is skipping beats. These  symptoms may represent a serious problem that is an emergency. Do not wait to see if the symptoms will go away. Get medical help right away. Call your local emergency services (911 in the [place] Do not drive yourself to the hospital.   This information is not intended to replace advice given to you by your health care provider. Make sure you discuss any questions you have with your health care provider.   Document Released: 11/07/2004 Document Revised: 02/18/2014 Document Reviewed: 09/03/2013 Elsevier Interactive Patient Education [employer].

## 2015-08-21 ENCOUNTER — Telehealth: Payer: Self-pay | Admitting: Family Medicine

## 2015-08-21 ENCOUNTER — Encounter: Payer: Self-pay | Admitting: Cardiology

## 2015-08-21 ENCOUNTER — Ambulatory Visit (INDEPENDENT_AMBULATORY_CARE_PROVIDER_SITE_OTHER): Payer: BLUE CROSS/BLUE SHIELD | Admitting: Cardiology

## 2015-08-21 ENCOUNTER — Encounter: Payer: Self-pay | Admitting: *Deleted

## 2015-08-21 VITALS — BP 128/79 | HR 61 | Ht 70.0 in | Wt 200.4 lb

## 2015-08-21 DIAGNOSIS — R079 Chest pain, unspecified: Secondary | ICD-10-CM | POA: Diagnosis not present

## 2015-08-21 NOTE — Progress Notes (Signed)
Clinical Summary [name] is a 50 y.o.male seen today as a new patient, he is referred by Dr Kenn File.    1. Chest pain - started about 5 months ago. Pressure/sharp pain left sided into left shoulder, 7/10. Can occur at rest or with exertion. Can have some SOB related. Occasionally positional. Lasts 10-15 minutes. Occurs daily.  - increase in frequency, mild increase in severity since onset - occasional palpiations that are not releated - no relation to food   PMH: HTN, maternal uncle MI early 38s, maternal uncle MI?, paternal uncle MI 50s, paternal uncle MI 88s      Past Medical History  Diagnosis Date  . Diverticulitis      No Known Allergies   Current Outpatient Prescriptions  Medication Sig Dispense Refill  . allopurinol (ZYLOPRIM) 300 MG tablet   0  . citalopram (CELEXA) 20 MG tablet Take 1 tablet (20 mg total) by mouth daily. 30 tablet 5  . lisinopril-hydrochlorothiazide (ZESTORETIC) 20-12.5 MG tablet Take 1 tablet by mouth daily. 90 tablet 3  . omeprazole (PRILOSEC) 20 MG capsule   0   No current facility-administered medications for this visit.     Past Surgical History  Procedure Laterality Date  . Cholecystectomy       No Known Allergies    Family History  Problem Relation Age of Onset  . Diabetes Mother   . Cancer Maternal Aunt   . Cancer Maternal Uncle   . Heart disease Maternal Uncle   . Hypertension Maternal Uncle   . Cancer Paternal Aunt   . Cancer Paternal Uncle   . Heart disease Paternal Uncle   . Hypertension Paternal Uncle   . Arthritis Maternal Grandmother      Social History Alejandro Rivera reports that he has never smoked. He has quit using smokeless tobacco. Alejandro Rivera reports that he drinks alcohol.   Review of Systems CONSTITUTIONAL: No weight loss, fever, chills, weakness or fatigue.  HEENT: Eyes: No visual loss, blurred vision, double vision or yellow sclerae.No hearing loss, sneezing, congestion, runny  nose or sore throat.  SKIN: No rash or itching.  CARDIOVASCULAR: per HPI RESPIRATORY: No shortness of breath, cough or sputum.  GASTROINTESTINAL: No anorexia, nausea, vomiting or diarrhea. No abdominal pain or blood.  GENITOURINARY: No burning on urination, no polyuria NEUROLOGICAL: No headache, dizziness, syncope, paralysis, ataxia, numbness or tingling in the extremities. No change in bowel or bladder control.  MUSCULOSKELETAL: No muscle, back pain, joint pain or stiffness.  LYMPHATICS: No enlarged nodes. No history of splenectomy.  PSYCHIATRIC: No history of depression or anxiety.  ENDOCRINOLOGIC: No reports of sweating, cold or heat intolerance. No polyuria or polydipsia.  Marland Kitchen   Physical Examination Filed Vitals:   08/21/15 1540  BP: 128/79  Pulse: 61   Filed Vitals:   08/21/15 1540  Height: 5\' 10"  (1.778 m)  Weight: 200 lb 6.4 oz (90.901 kg)    Gen: resting comfortably, no acute distress HEENT: no scleral icterus, pupils equal round and reactive, no palptable cervical adenopathy,  CV: RRR, no m/r/g, no jvd Resp: Clear to auscultation bilaterally GI: abdomen is soft, non-tender, non-distended, normal bowel sounds, no hepatosplenomegaly MSK: extremities are warm, no edema.  Skin: warm, no rash Neuro:  no focal deficits Psych: appropriate affect   Diagnostic Studies     Assessment and Plan  1. Chest pain - unclear etiology, he does have some CAD risk factors. Baseline EKG reviewed and shows NSR.  - will plan  for GXT to further evaluate.    F/u pending stress results.       Arnoldo Lenis, M.D.

## 2015-08-21 NOTE — Patient Instructions (Signed)
Your physician recommends that you schedule a follow-up appointment TO BE DETERMINED AFTER TESTING   Your physician recommends that you continue on your current medications as directed. Please refer to the Current Medication list given to you today.  Your physician has requested that you have an exercise tolerance test. For further information please visit [email or web address]. Please also follow instruction sheet, as given.  Thank you for choosing [facility]!!    

## 2015-08-22 ENCOUNTER — Ambulatory Visit (HOSPITAL_COMMUNITY)
Admission: RE | Admit: 2015-08-22 | Discharge: 2015-08-22 | Disposition: A | Discharge status: Home / Self Care | Payer: BLUE CROSS/BLUE SHIELD | Source: Ambulatory Visit | Attending: Cardiology | Admitting: Cardiology

## 2015-08-22 DIAGNOSIS — R079 Chest pain, unspecified: Secondary | ICD-10-CM | POA: Diagnosis not present

## 2015-08-22 LAB — EXERCISE TOLERANCE TEST
CHL CUP RESTING HR STRESS: 62 {beats}/min
CHL RATE OF PERCEIVED EXERTION: 14
CSEPED: 6 min
CSEPEDS: 50 s
Estimated workload: 9.5 METS
MPHR: 169 {beats}/min
Peak HR: 169 {beats}/min
Percent HR: 100 %

## 2015-08-24 ENCOUNTER — Telehealth: Payer: Self-pay | Admitting: *Deleted

## 2015-08-24 NOTE — Telephone Encounter (Signed)
Pt aware, routed to pcp - recall for 6 months placed.

## 2015-08-24 NOTE — Telephone Encounter (Signed)
-----   Message from Arnoldo Lenis, MD sent at 08/23/2015  3:40 PM EDT ----- Stress test looks good, no evidence of his symptoms being heart related. He can f/u with his pcp to continue to discuss noncardiac causes of chest pain. Can f/u with [place] in 6 months.  Zandra Abts MD

## 2015-08-25 NOTE — Telephone Encounter (Signed)
Pt states he felt like his stomach was in knots, diarhhea, couldn't focus at work, and felt like he couldn't catch his breath so he had to leave work. Pt stopped taking the citalopram and is still having anxiety and the related chest pain. Pt has a follow up appt with you on 7/24 and has already seen Cardiologist. Pt is aware that you are out of the office until Monday.

## 2015-08-29 MED ORDER — PAROXETINE HCL 20 MG PO TABS: 20.0000 mg | ORAL_TABLET | Freq: Every day | ORAL | Status: DC

## 2015-08-29 NOTE — Addendum Note (Signed)
Addended by: Wardell Heath on: 08/29/2015 09:01 AM   Modules accepted: Orders

## 2015-08-29 NOTE — Telephone Encounter (Signed)
Ok not to take med,   We can try alternative med but not ethat results are slow, meaning he will see improvement in 2-6 weeks of taking medications- most of the time 6 weeks.   Ok with paxil 20 mg daily for 2-3 weeks then follow up  Laroy Apple, MD [facility] 08/29/2015, 7:38 AM

## 2015-08-29 NOTE — Telephone Encounter (Signed)
Patient aware of medication change and will call back to make an appt.

## 2015-09-04 ENCOUNTER — Ambulatory Visit: Payer: BLUE CROSS/BLUE SHIELD | Admitting: Family Medicine

## 2015-09-28 ENCOUNTER — Ambulatory Visit (INDEPENDENT_AMBULATORY_CARE_PROVIDER_SITE_OTHER): Payer: BLUE CROSS/BLUE SHIELD | Admitting: Family Medicine

## 2015-09-28 ENCOUNTER — Encounter: Payer: Self-pay | Admitting: Family Medicine

## 2015-09-28 VITALS — BP 123/86 | HR 70 | Temp 98.3°F | Ht 70.0 in | Wt 203.4 lb

## 2015-09-28 DIAGNOSIS — R079 Chest pain, unspecified: Secondary | ICD-10-CM | POA: Diagnosis not present

## 2015-09-28 DIAGNOSIS — F411 Generalized anxiety disorder: Secondary | ICD-10-CM | POA: Diagnosis not present

## 2015-09-28 MED ORDER — PAROXETINE HCL 20 MG PO TABS: 20.0000 mg | ORAL_TABLET | Freq: Every day | ORAL | 3 refills | Status: DC

## 2015-09-28 NOTE — Progress Notes (Signed)
   HPI  Patient presents today to follow-up for anxiety and chest pain.  Patient explains that he feels much better after starting Paxil. He states that his feelings of anxiety have improved, he is imaging is everyday stress better.  He's had much improvement in the chest pain, he still has momentary left-sided chest pain, although he is now clearly able to associate it with periods of anxiety.  He's had a good cardiac workup including exercise stress test since our last visit that was completely normal.  He is happy with the medication Paxil, he had diarrhea with citalopram.  PMH: Smoking status noted ROS: Per HPI  Objective: BP 123/86   Pulse 70   Temp 98.3 F (36.8 C) (Oral)   Ht 5\' 10"  (1.778 m)   Wt 203 lb 6 oz (92.3 kg)   BMI 29.18 kg/m  Gen: NAD, alert, cooperative with exam HEENT: NCAT CV: RRR, good S1/S2, no murmur Resp: CTABL, no wheezes, non-labored Ext: No edema, warm Neuro: Alert and oriented, No gross deficits  Psych: Denies SI  Assessment and plan:  # Generalized anxiety disorder Improving on Paxil Continue Return to clinic in 3 months for routine follow-up  # Chest pain Likely completely due to anxiety. Cardiac workup negative Return to clinic as needed, nearly completely improved, hopefully will be completely resolved after the full effect of Paxil is set in after 6 weeks.    Meds ordered this encounter  Medications  . PARoxetine (PAXIL) 20 MG tablet    Sig: Take 1 tablet (20 mg total) by mouth daily.    Dispense:  90 tablet    Refill:  [place], MD [facility] 09/28/2015, 9:17 AM

## 2015-09-28 NOTE — Patient Instructions (Signed)
Great to see you!  Come back in 3 months for follow up of anxiety

## 2015-10-24 ENCOUNTER — Encounter: Payer: Self-pay | Admitting: Gastroenterology

## 2015-12-15 ENCOUNTER — Ambulatory Visit (AMBULATORY_SURGERY_CENTER): Payer: Self-pay | Admitting: *Deleted

## 2015-12-15 VITALS — Ht 70.0 in | Wt 217.4 lb

## 2015-12-15 DIAGNOSIS — Z1211 Encounter for screening for malignant neoplasm of colon: Secondary | ICD-10-CM

## 2015-12-15 MED ORDER — NA SULFATE-K SULFATE-MG SULF 17.5-3.13-1.6 GM/177ML PO SOLN: ORAL | 0 refills | Status: DC

## 2015-12-15 NOTE — Progress Notes (Signed)
No egg or soy allergy  No anesthesia or intubation problems per pt  No diet medications taken   

## 2015-12-19 ENCOUNTER — Encounter: Payer: Self-pay | Admitting: Gastroenterology

## 2015-12-29 ENCOUNTER — Ambulatory Visit (AMBULATORY_SURGERY_CENTER): Payer: BLUE CROSS/BLUE SHIELD | Admitting: Gastroenterology

## 2015-12-29 ENCOUNTER — Encounter: Payer: Self-pay | Admitting: Gastroenterology

## 2015-12-29 VITALS — BP 102/56 | HR 70 | Temp 98.6°F | Resp 15 | Ht 70.0 in | Wt 217.0 lb

## 2015-12-29 DIAGNOSIS — Z1212 Encounter for screening for malignant neoplasm of rectum: Secondary | ICD-10-CM | POA: Diagnosis not present

## 2015-12-29 DIAGNOSIS — Z1211 Encounter for screening for malignant neoplasm of colon: Secondary | ICD-10-CM | POA: Diagnosis not present

## 2015-12-29 MED ORDER — SODIUM CHLORIDE 0.9 % IV SOLN: 500.0000 mL | INTRAVENOUS | Status: DC

## 2015-12-29 NOTE — Progress Notes (Signed)
To recovery Report to RN  VSS

## 2015-12-29 NOTE — Patient Instructions (Signed)
Impression/Recommendations:  Diverticulosis handout given to patient.  Repeat colonoscopy in 10 years for screening.  YOU HAD AN ENDOSCOPIC PROCEDURE TODAY AT [facility]:   Refer to the procedure report that was given to you for any specific questions about what was found during the examination.  If the procedure report does not answer your questions, please call your gastroenterologist to clarify.  If you requested that your care partner not be given the details of your procedure findings, then the procedure report has been included in a sealed envelope for you to review at your convenience later.  YOU SHOULD EXPECT: Some feelings of bloating in the abdomen. Passage of more gas than usual.  Walking can help get rid of the air that was put into your GI tract during the procedure and reduce the bloating. If you had a lower endoscopy (such as a colonoscopy or flexible sigmoidoscopy) you may notice spotting of blood in your stool or on the toilet paper. If you underwent a bowel prep for your procedure, you may not have a normal bowel movement for a few days.  Please Note:  You might notice some irritation and congestion in your nose or some drainage.  This is from the oxygen used during your procedure.  There is no need for concern and it should clear up in a day or so.  SYMPTOMS TO REPORT IMMEDIATELY:   Following lower endoscopy (colonoscopy or flexible sigmoidoscopy):  Excessive amounts of blood in the stool  Significant tenderness or worsening of abdominal pains  Swelling of the abdomen that is new, acute  Fever of 100F or higher For urgent or emergent issues, a gastroenterologist can be reached at any hour by calling [phone].   DIET:  We do recommend a small meal at first, but then you may proceed to your regular diet.  Drink plenty of fluids but you should avoid alcoholic beverages for 24 hours.  ACTIVITY:  You should plan to take it easy for the rest of today and  you should NOT DRIVE or use heavy machinery until tomorrow (because of the sedation medicines used during the test).    FOLLOW UP: Our staff will call the number listed on your records the next business day following your procedure to check on you and address any questions or concerns that you may have regarding the information given to you following your procedure. If we do not reach you, we will leave a message.  However, if you are feeling well and you are not experiencing any problems, there is no need to return our call.  We will assume that you have returned to your regular daily activities without incident.  If any biopsies were taken you will be contacted by phone or by letter within the next 1-3 weeks.  Please call us at [phone] if you have not heard about the biopsies in 3 weeks.    SIGNATURES/CONFIDENTIALITY: You and/or your care partner have signed paperwork which will be entered into your electronic medical record.  These signatures attest to the fact that that the information above on your After Visit Summary has been reviewed and is understood.  Full responsibility of the confidentiality of this discharge information lies with you and/or your care-partner.

## 2015-12-29 NOTE — Op Note (Signed)
[facility] Patient Name: Alejandro Rivera Procedure Date: 12/29/2015 8:31 AM MRN: [id number] Endoscopist: Mallie Mussel L. Loletha Carrow , MD Age: 50 Referring MD:  Date of Birth: May 20, 1965 Gender: Male Account #: [id number] Procedure:                Colonoscopy Indications:              Screening for colorectal malignant neoplasm, This                            is the patient's first colonoscopy Medicines:                Monitored Anesthesia Care Procedure:                Pre-Anesthesia Assessment:                           - Prior to the procedure, a History and Physical                            was performed, and patient medications and                            allergies were reviewed. The patient's tolerance of                            previous anesthesia was also reviewed. The risks                            and benefits of the procedure and the sedation                            options and risks were discussed with the patient.                            All questions were answered, and informed consent                            was obtained. Prior Anticoagulants: The patient has                            taken no previous anticoagulant or antiplatelet                            agents. ASA Grade Assessment: II - A patient with                            mild systemic disease. After reviewing the risks                            and benefits, the patient was deemed in                            satisfactory condition to undergo the procedure.  After obtaining informed consent, the colonoscope                            was passed under direct vision. Throughout the                            procedure, the patient's blood pressure, pulse, and                            oxygen saturations were monitored continuously. The                            Model CF-HQ190L [id number]) scope was introduced                            through the anus and  advanced to the the cecum,                            identified by appendiceal orifice and ileocecal                            valve. The colonoscopy was performed without                            difficulty. The patient tolerated the procedure                            well. The quality of the bowel preparation was                            excellent. The ileocecal valve, appendiceal                            orifice, and rectum were photographed. The quality                            of the bowel preparation was evaluated using the                            BBPS [facility] Bowel Preparation Scale) with scores                            of: Right Colon = 3, Transverse Colon = 3 and Left                            Colon = 3 (entire mucosa seen well with no residual                            staining, small fragments of stool or opaque                            liquid). The total BBPS score equals 9. The bowel  preparation used was SUPREP. Scope In: 8:39:57 AM Scope Out: 8:51:31 AM Scope Withdrawal Time: 0 hours 9 minutes 48 seconds  Total Procedure Duration: 0 hours 11 minutes 34 seconds  Findings:                 The perianal and digital rectal examinations were                            normal.                           Multiple diverticula were found in the left colon                            and right colon.                           The exam was otherwise without abnormality on                            direct and retroflexion views. Complications:            No immediate complications. Estimated Blood Loss:     Estimated blood loss: none. Impression:               - Diverticulosis in the left colon and in the right                            colon.                           - The examination was otherwise normal on direct                            and retroflexion views.                           - No specimens collected. Recommendation:            - Patient has a contact number available for                            emergencies. The signs and symptoms of potential                            delayed complications were discussed with the                            patient. Return to normal activities tomorrow.                            Written discharge instructions were provided to the                            patient.                           - Resume previous diet.                           -  Continue present medications.                           - Repeat colonoscopy in 10 years for screening                            purposes. Saima Monterroso L. Loletha Carrow, MD 12/29/2015 8:54:33 AM This report has been signed electronically.

## 2016-01-01 ENCOUNTER — Telehealth: Payer: Self-pay

## 2016-01-01 NOTE — Telephone Encounter (Signed)
  Follow up Call-  Call back number 12/29/2015  Post procedure Call Back phone  # [phone]  Permission to leave phone message Yes     Patient questions:  Do you have a fever, pain , or abdominal swelling? No. Pain Score  0 *  Have you tolerated food without any problems? Yes.    Have you been able to return to your normal activities? Yes.    Do you have any questions about your discharge instructions: Diet   No. Medications  No. Follow up visit  No.  Do you have questions or concerns about your Care? No.  Actions: * If pain score is 4 or above: No action needed, pain <4.

## 2016-01-08 ENCOUNTER — Encounter: Payer: Self-pay | Admitting: Family Medicine

## 2016-01-08 DIAGNOSIS — K5730 Diverticulosis of large intestine without perforation or abscess without bleeding: Secondary | ICD-10-CM | POA: Insufficient documentation

## 2016-01-16 ENCOUNTER — Encounter: Payer: Self-pay | Admitting: Family Medicine

## 2016-01-16 ENCOUNTER — Ambulatory Visit (INDEPENDENT_AMBULATORY_CARE_PROVIDER_SITE_OTHER): Payer: BLUE CROSS/BLUE SHIELD | Admitting: Family Medicine

## 2016-01-16 VITALS — BP 134/88 | HR 74 | Temp 98.7°F | Ht 70.0 in | Wt 218.0 lb

## 2016-01-16 DIAGNOSIS — J069 Acute upper respiratory infection, unspecified: Secondary | ICD-10-CM

## 2016-01-16 DIAGNOSIS — F411 Generalized anxiety disorder: Secondary | ICD-10-CM

## 2016-01-16 DIAGNOSIS — B9789 Other viral agents as the cause of diseases classified elsewhere: Secondary | ICD-10-CM | POA: Diagnosis not present

## 2016-01-16 DIAGNOSIS — I10 Essential (primary) hypertension: Secondary | ICD-10-CM | POA: Diagnosis not present

## 2016-01-16 DIAGNOSIS — K5730 Diverticulosis of large intestine without perforation or abscess without bleeding: Secondary | ICD-10-CM | POA: Diagnosis not present

## 2016-01-16 NOTE — Progress Notes (Signed)
   HPI  Patient presents today here for follow-up of anxiety and hypertension.   Patient feels well and is doing well with the medication. He denies suicidal thoughts. He states his anxiety is much better and he has not had any additional chest pain.  He has had a cold for the last few days and has improved very well. He complains of sinus congestion and cough. He has had some central chest pain with cough or deep inspiration. He denies dyspnea, difficulty tolerating foods or fluids, fevers, chills, sweats, or other concerns.  PMH: Smoking status noted ROS: Per HPI  Objective: BP 134/88   Pulse 74   Temp 98.7 F (37.1 C) (Oral)   Ht 5\' 10"  (1.778 m)   Wt 218 lb (98.9 kg)   BMI 31.28 kg/m  Gen: NAD, alert, cooperative with exam HEENT: NCAT, was normal bilaterally, oropharynx moist, nares clear CV: RRR, good S1/S2, no murmur Resp: CTABL, no wheezes, non-labored Ext: No edema, warm Neuro: Alert and oriented, No gross deficits  Assessment and plan:  # Anxiety Well-controlled on Paxil, continue No suicidal thoughts, tolerating medication well without side effects  # Hypertension Well-controlled currently, continue Prinzide Labs due in May  # Viral URI Resolving, note written for work as he has missed 2 days of work.  # Diverticulosis of colon Noted on recent colonoscopy, asymptomatic   Laroy Apple, MD [facility] 01/16/2016, 3:50 PM

## 2016-01-16 NOTE — Patient Instructions (Addendum)
Great to see yoU!  Come bcak to see [place] in May for a physical exam   I'm glad you are doing well

## 2016-03-26 ENCOUNTER — Encounter: Payer: Self-pay | Admitting: Family Medicine

## 2016-03-26 ENCOUNTER — Ambulatory Visit (INDEPENDENT_AMBULATORY_CARE_PROVIDER_SITE_OTHER): Payer: BLUE CROSS/BLUE SHIELD | Admitting: Family Medicine

## 2016-03-26 VITALS — BP 127/81 | HR 75 | Temp 97.6°F | Ht 70.0 in | Wt 218.6 lb

## 2016-03-26 DIAGNOSIS — J209 Acute bronchitis, unspecified: Secondary | ICD-10-CM | POA: Diagnosis not present

## 2016-03-26 MED ORDER — AZITHROMYCIN 250 MG PO TABS: ORAL_TABLET | ORAL | 0 refills | Status: DC

## 2016-03-26 MED ORDER — METHYLPREDNISOLONE ACETATE 80 MG/ML IJ SUSP
80.0000 mg | Freq: Once | INTRAMUSCULAR | Status: AC
  Administered 2016-03-26: 80 mg via INTRAMUSCULAR

## 2016-03-26 NOTE — Addendum Note (Signed)
Addended by: Karle Plumber on: 03/26/2016 08:50 AM   Modules accepted: Orders

## 2016-03-26 NOTE — Patient Instructions (Signed)
Great to see you!  The medicines should help clear up your cough, it may linger on, however, much more mild for a few weeks.   Please come back or call with any concerns.   If you do develop a cough that is just not getting better then we can change the blood pressure medication.     Acute Bronchitis, Adult Acute bronchitis is when air tubes (bronchi) in the lungs suddenly get swollen. The condition can make it hard to breathe. It can also cause these symptoms:  A cough.  Coughing up clear, yellow, or green mucus.  Wheezing.  Chest congestion.  Shortness of breath.  A fever.  Body aches.  Chills.  A sore throat. Follow these instructions at home: Medicines  Take over-the-counter and prescription medicines only as told by your doctor.  If you were prescribed an antibiotic medicine, take it as told by your doctor. Do not stop taking the antibiotic even if you start to feel better. General instructions  Rest.  Drink enough fluids to keep your pee (urine) clear or pale yellow.  Avoid smoking and secondhand smoke. If you smoke and you need help quitting, ask your doctor. Quitting will help your lungs heal faster.  Use an inhaler, cool mist vaporizer, or humidifier as told by your doctor.  Keep all follow-up visits as told by your doctor. This is important. How is this prevented? To lower your risk of getting this condition again:  Wash your hands often with soap and water. If you cannot use soap and water, use hand sanitizer.  Avoid contact with people who have cold symptoms.  Try not to touch your hands to your mouth, nose, or eyes.  Make sure to get the flu shot every year. Contact a doctor if:  Your symptoms do not get better in 2 weeks. Get help right away if:  You cough up blood.  You have chest pain.  You have very bad shortness of breath.  You become dehydrated.  You faint (pass out) or keep feeling like you are going to pass out.  You keep  throwing up (vomiting).  You have a very bad headache.  Your fever or chills gets worse. This information is not intended to replace advice given to you by your health care provider. Make sure you discuss any questions you have with your health care provider. Document Released: 07/17/2007 Document Revised: 09/06/2015 Document Reviewed: 07/19/2015 Elsevier Interactive Patient Education  2017 [employer].

## 2016-03-26 NOTE — Progress Notes (Signed)
   HPI  Patient presents today with cough.  Patient explains that over the last month or so he's had persistent cough.  About 3 weeks ago he had a few days of chills and sweats. Over the last 2 weeks he's developed more wheezing and congestion over the last 2-3 days feels that his malaise is getting worse.  He feels that he steadily worsening would like to pursue treatment today.  He saw PA work who stated that it may be an illness, but also maybe his blood pressure medication. He has had a little bit of a nagging cough for a few months before this.   PMH: Smoking status noted ROS: Per HPI  Objective: BP 127/81   Pulse 75   Temp 97.6 F (36.4 C) (Oral)   Ht 5\' 10"  (1.778 m)   Wt 218 lb 9.6 oz (99.2 kg)   BMI 31.37 kg/m  Gen: NAD, alert, cooperative with exam HEENT: NCAT, oropharynx moist and clear, nares clear, TMs normal bilaterally CV: RRR, good S1/S2, no murmur Resp: CTABL, no wheezes, non-labored- some scattered coarse breath sounds Abd: SNTND, BS present, no guarding or organomegaly Ext: No edema, warm Neuro: Alert and oriented, No gross deficits  Assessment and plan:  # Cough 1 month waxing and waning illness with recent worsening Treat with azithromycin plus IM Depo-Medrol Discussed supportive care and usual course of illness Could have underlying ACE inhibitor induced cough, however this is unclear at this time, would recommend treating acute illness and changing if persistent. (patient and I agreed on this.)   Meds ordered this encounter  Medications  . omeprazole (PRILOSEC) 40 MG capsule    Refill:  0  . azithromycin (ZITHROMAX) 250 MG tablet    Sig: Take 2 tablets on day 1 and 1 tablet daily after that    Dispense:  6 tablet    Refill:  0    Laroy Apple, MD Tr[facility] Schro[facility] 03/26/2016, 8:18 AM

## 2016-07-09 ENCOUNTER — Other Ambulatory Visit: Payer: Self-pay | Admitting: Family

## 2016-07-09 DIAGNOSIS — I10 Essential (primary) hypertension: Secondary | ICD-10-CM

## 2016-08-09 DIAGNOSIS — H5203 Hypermetropia, bilateral: Secondary | ICD-10-CM | POA: Diagnosis not present

## 2016-08-09 DIAGNOSIS — H524 Presbyopia: Secondary | ICD-10-CM | POA: Diagnosis not present

## 2016-09-26 ENCOUNTER — Other Ambulatory Visit: Payer: Self-pay | Admitting: Family Medicine

## 2016-09-26 NOTE — Telephone Encounter (Signed)
Last seen 03/26/16  Dr Bradshaw 

## 2016-10-10 ENCOUNTER — Other Ambulatory Visit: Payer: Self-pay | Admitting: Family Medicine

## 2016-10-10 DIAGNOSIS — I10 Essential (primary) hypertension: Secondary | ICD-10-CM

## 2016-10-11 NOTE — Telephone Encounter (Signed)
Refilled prinzide, needs appt.  Alejandro Apple, MD [facility] 10/11/2016, 10:14 AM

## 2016-10-11 NOTE — Telephone Encounter (Signed)
Message left for patient that he needs to be seen

## 2016-10-11 NOTE — Telephone Encounter (Signed)
Last seen 03/26/16  Dr Wendi Snipes

## 2016-10-16 ENCOUNTER — Ambulatory Visit (INDEPENDENT_AMBULATORY_CARE_PROVIDER_SITE_OTHER): Payer: BLUE CROSS/BLUE SHIELD | Admitting: Family Medicine

## 2016-10-16 ENCOUNTER — Encounter: Payer: Self-pay | Admitting: Family Medicine

## 2016-10-16 VITALS — BP 144/97 | HR 70 | Temp 98.7°F | Ht 70.0 in | Wt 229.4 lb

## 2016-10-16 DIAGNOSIS — I10 Essential (primary) hypertension: Secondary | ICD-10-CM

## 2016-10-16 DIAGNOSIS — Z0000 Encounter for general adult medical examination without abnormal findings: Secondary | ICD-10-CM

## 2016-10-16 DIAGNOSIS — F411 Generalized anxiety disorder: Secondary | ICD-10-CM

## 2016-10-16 MED ORDER — LISINOPRIL-HYDROCHLOROTHIAZIDE 20-12.5 MG PO TABS: 1.0000 | ORAL_TABLET | Freq: Every day | ORAL | 3 refills | Status: DC

## 2016-10-16 NOTE — Progress Notes (Signed)
   HPI  Patient presents today for follow-up hypertension, anxiety, and annual physical exam.  Patient is occupationally active and walking a few times a week. He is watching his diet moderately. He is aware of his 10 pound weight gain.  He has good medication compliance but has been out of blood pressure medication for 1 week, his pharmacy stated that it was not refilled, although we have record it was sent on 8/31.  Anxiety Patient's doing very well with Paxil, denies suicidal thoughts He has noticed that her hand dermatitis has resolved after taking the medication. Chest pain resolved.  PMH: Smoking status noted ROS: Per HPI  Objective: BP (!) 144/97   Pulse 70   Temp 98.7 F (37.1 C) (Oral)   Ht '5\' 10"'$  (1.778 m)   Wt 229 lb 6.4 oz (104.1 kg)   BMI 32.92 kg/m  Gen: NAD, alert, cooperative with exam HEENT: NCAT, EOMI, PERRL CV: RRR, good S1/S2, no murmur Resp: CTABL, no wheezes, non-labored Abd: SNTND, BS present, no guarding or organomegaly Ext: No edema, warm Neuro: Alert and oriented, No gross deficits  Depression screen [facility] 2/9 10/16/2016 03/26/2016 01/16/2016 09/28/2015 08/18/2015  Decreased Interest 0 0 0 0 0  Down, Depressed, Hopeless 0 0 0 0 0  PHQ - 2 Score 0 0 0 0 0     Assessment and plan:  # Annual physical exam Normal exam, discussed prostate testing, checking PSA today Colonoscopy up-to-date Patient has appointment with GI to discuss GERD coming up  # Hypertension Elevated today, however normally well controlled on Prinzide, patient has been out for 1 week. Refill Labs  # Anxiety Stable, doing very well with SSRI, no changes    Orders Placed This Encounter  Procedures  . CBC with Differential/Platelet  . CMP14+EGFR  . Lipid panel  . TSH  . PSA    Meds ordered this encounter  Medications  . lisinopril-hydrochlorothiazide (PRINZIDE,ZESTORETIC) 20-12.5 MG tablet    Sig: Take 1 tablet by mouth daily.    Dispense:  90 tablet    Refill:  [place], MD [facility] 10/16/2016, 11:22 AM

## 2016-10-16 NOTE — Patient Instructions (Signed)
Great to see you!  Come back in 6 months unless you need [place] sooner.    Health Maintenance, Male A healthy lifestyle and preventive care is important for your health and wellness. Ask your health care provider about what schedule of regular examinations is right for you. What should I know about weight and diet? Eat a Healthy Diet  Eat plenty of vegetables, fruits, whole grains, low-fat dairy products, and lean protein.  Do not eat a lot of foods high in solid fats, added sugars, or salt.  Maintain a Healthy Weight Regular exercise can help you achieve or maintain a healthy weight. You should:  Do at least 150 minutes of exercise each week. The exercise should increase your heart rate and make you sweat (moderate-intensity exercise).  Do strength-training exercises at least twice a week.  Watch Your Levels of Cholesterol and Blood Lipids  Have your blood tested for lipids and cholesterol every 5 years starting at 52 years of age. If you are at high risk for heart disease, you should start having your blood tested when you are 52 years old. You may need to have your cholesterol levels checked more often if: ? Your lipid or cholesterol levels are high. ? You are older than 52 years of age. ? You are at high risk for heart disease.  What should I know about cancer screening? Many types of cancers can be detected early and may often be prevented. Lung Cancer  You should be screened every year for lung cancer if: ? You are a current smoker who has smoked for at least 30 years. ? You are a former smoker who has quit within the past 15 years.  Talk to your health care provider about your screening options, when you should start screening, and how often you should be screened.  Colorectal Cancer  Routine colorectal cancer screening usually begins at 52 years of age and should be repeated every 5-10 years until you are 52 years old. You may need to be screened more often if early forms  of precancerous polyps or small growths are found. Your health care provider may recommend screening at an earlier age if you have risk factors for colon cancer.  Your health care provider may recommend using home test kits to check for hidden blood in the stool.  A small camera at the end of a tube can be used to examine your colon (sigmoidoscopy or colonoscopy). This checks for the earliest forms of colorectal cancer.  Prostate and Testicular Cancer  Depending on your age and overall health, your health care provider may do certain tests to screen for prostate and testicular cancer.  Talk to your health care provider about any symptoms or concerns you have about testicular or prostate cancer.  Skin Cancer  Check your skin from head to toe regularly.  Tell your health care provider about any new moles or changes in moles, especially if: ? There is a change in a mole's size, shape, or color. ? You have a mole that is larger than a pencil eraser.  Always use sunscreen. Apply sunscreen liberally and repeat throughout the day.  Protect yourself by wearing long sleeves, pants, a wide-brimmed hat, and sunglasses when outside.  What should I know about heart disease, diabetes, and high blood pressure?  If you are 36-68 years of age, have your blood pressure checked every 3-5 years. If you are 22 years of age or older, have your blood pressure checked every year. You  should have your blood pressure measured twice-once when you are at a hospital or clinic, and once when you are not at a hospital or clinic. Record the average of the two measurements. To check your blood pressure when you are not at a hospital or clinic, you can use: ? An automated blood pressure machine at a pharmacy. ? A home blood pressure monitor.  Talk to your health care provider about your target blood pressure.  If you are between 39-58 years old, ask your health care provider if you should take aspirin to prevent heart  disease.  Have regular diabetes screenings by checking your fasting blood sugar level. ? If you are at a normal weight and have a low risk for diabetes, have this test once every three years after the age of 38. ? If you are overweight and have a high risk for diabetes, consider being tested at a younger age or more often.  A one-time screening for abdominal aortic aneurysm (AAA) by ultrasound is recommended for men aged 40-75 years who are current or former smokers. What should I know about preventing infection? Hepatitis B If you have a higher risk for hepatitis B, you should be screened for this virus. Talk with your health care provider to find out if you are at risk for hepatitis B infection. Hepatitis C Blood testing is recommended for:  Everyone born from [birth year] through 1966.  Anyone with known risk factors for hepatitis C.  Sexually Transmitted Diseases (STDs)  You should be screened each year for STDs including gonorrhea and chlamydia if: ? You are sexually active and are younger than 52 years of age. ? You are older than 52 years of age and your health care provider tells you that you are at risk for this type of infection. ? Your sexual activity has changed since you were last screened and you are at an increased risk for chlamydia or gonorrhea. Ask your health care provider if you are at risk.  Talk with your health care provider about whether you are at high risk of being infected with HIV. Your health care provider may recommend a prescription medicine to help prevent HIV infection.  What else can I do?  Schedule regular health, dental, and eye exams.  Stay current with your vaccines (immunizations).  Do not use any tobacco products, such as cigarettes, chewing tobacco, and e-cigarettes. If you need help quitting, ask your health care provider.  Limit alcohol intake to no more than 2 drinks per day. One drink equals 12 ounces of beer, 5 ounces of wine, or 1 ounces of  hard liquor.  Do not use street drugs.  Do not share needles.  Ask your health care provider for help if you need support or information about quitting drugs.  Tell your health care provider if you often feel depressed.  Tell your health care provider if you have ever been abused or do not feel safe at home. This information is not intended to replace advice given to you by your health care provider. Make sure you discuss any questions you have with your health care provider. Document Released: 07/27/2007 Document Revised: 09/27/2015 Document Reviewed: 11/01/2014 Elsevier Interactive Patient Education  [employer].

## 2016-10-17 LAB — CMP14+EGFR
ALK PHOS: 70 IU/L (ref 39–117)
ALT: 26 IU/L (ref 0–44)
AST: 23 IU/L (ref 0–40)
Albumin/Globulin Ratio: 1.6 (ref 1.2–2.2)
Albumin: 4.4 g/dL (ref 3.5–5.5)
BILIRUBIN TOTAL: 0.6 mg/dL (ref 0.0–1.2)
BUN/Creatinine Ratio: 13 (ref 9–20)
BUN: 14 mg/dL (ref 6–24)
CHLORIDE: 100 mmol/L (ref 96–106)
CO2: 22 mmol/L (ref 20–29)
Calcium: 9.4 mg/dL (ref 8.7–10.2)
Creatinine, Ser: 1.04 mg/dL (ref 0.76–1.27)
GFR calc Af Amer: 95 mL/min/{1.73_m2} (ref >59)
GFR calc non Af Amer: 82 mL/min/{1.73_m2} (ref >59)
Globulin, Total: 2.7 g/dL (ref 1.5–4.5)
Glucose: 111 mg/dL — ABNORMAL HIGH (ref 65–99)
POTASSIUM: 4.6 mmol/L (ref 3.5–5.2)
Sodium: 137 mmol/L (ref 134–144)
TOTAL PROTEIN: 7.1 g/dL (ref 6.0–8.5)

## 2016-10-17 LAB — LIPID PANEL
Chol/HDL Ratio: 4.6 ratio (ref 0.0–5.0)
Cholesterol, Total: 199 mg/dL (ref 100–199)
HDL: 43 mg/dL (ref >39)
LDL Calculated: 102 mg/dL — ABNORMAL HIGH (ref 0–99)
Triglycerides: 271 mg/dL — ABNORMAL HIGH (ref 0–149)
VLDL Cholesterol Cal: 54 mg/dL — ABNORMAL HIGH (ref 5–40)

## 2016-10-17 LAB — PSA: PROSTATE SPECIFIC AG, SERUM: 0.6 ng/mL (ref 0.0–4.0)

## 2016-10-17 LAB — CBC WITH DIFFERENTIAL/PLATELET
BASOS: 0 %
Basophils Absolute: 0 10*3/uL (ref 0.0–0.2)
EOS (ABSOLUTE): 0.2 10*3/uL (ref 0.0–0.4)
Eos: 3 %
Hematocrit: 42.5 % (ref 37.5–51.0)
Hemoglobin: 14.1 g/dL (ref 13.0–17.7)
Immature Grans (Abs): 0 10*3/uL (ref 0.0–0.1)
Immature Granulocytes: 0 %
LYMPHS ABS: 2 10*3/uL (ref 0.7–3.1)
Lymphs: 22 %
MCH: 32 pg (ref 26.6–33.0)
MCHC: 33.2 g/dL (ref 31.5–35.7)
MCV: 97 fL (ref 79–97)
MONOS ABS: 0.8 10*3/uL (ref 0.1–0.9)
Monocytes: 9 %
NEUTROS ABS: 6.1 10*3/uL (ref 1.4–7.0)
Neutrophils: 66 %
PLATELETS: 228 10*3/uL (ref 150–379)
RBC: 4.4 x10E6/uL (ref 4.14–5.80)
RDW: 14 % (ref 12.3–15.4)
WBC: 9.1 10*3/uL (ref 3.4–10.8)

## 2016-10-17 LAB — TSH: TSH: 3.07 u[IU]/mL (ref 0.450–4.500)

## 2016-11-04 ENCOUNTER — Encounter: Payer: Self-pay | Admitting: Gastroenterology

## 2016-11-04 ENCOUNTER — Ambulatory Visit (INDEPENDENT_AMBULATORY_CARE_PROVIDER_SITE_OTHER): Payer: BLUE CROSS/BLUE SHIELD | Admitting: Gastroenterology

## 2016-11-04 VITALS — BP 136/96 | HR 76 | Ht 69.5 in | Wt 225.1 lb

## 2016-11-04 DIAGNOSIS — E663 Overweight: Secondary | ICD-10-CM

## 2016-11-04 DIAGNOSIS — K219 Gastro-esophageal reflux disease without esophagitis: Secondary | ICD-10-CM | POA: Diagnosis not present

## 2016-11-04 NOTE — Patient Instructions (Addendum)
If you are age 52 or older, your body mass index should be between 23-30. Your Body mass index is 32.77 kg/m. If this is out of the aforementioned range listed, please consider follow up with your Primary Care Provider.  If you are age 89 or younger, your body mass index should be between 19-25. Your Body mass index is 32.77 kg/m. If this is out of the aformentioned range listed, please consider follow up with your Primary Care Provider.   Thank you.  Gastroesophageal Reflux Scan A gastroesophageal reflux scan is a procedure that is used to check for gastroesophageal reflux, which is the backward flow of stomach contents into the tube that carries food from the mouth to the stomach (esophagus). The scan can also show if any stomach contents are inhaled (aspirated) into your lungs. You may need this scan if you have symptoms such as heartburn, vomiting, swallowing problems, or regurgitation. Regurgitation means that swallowed food is returning from the stomach to the esophagus. For this scan, you will drink a liquid that contains a small amount of a radioactive substance (tracer). A scanner with a camera that detects the radioactive tracer is used to see if any of the material backs up into your esophagus. Tell a health care provider about:  Any allergies you have.  All medicines you are taking, including vitamins, herbs, eye drops, creams, and over-the-counter medicines.  Any blood disorders you have.  Any surgeries you have had.  Any medical conditions you have.  If you are pregnant or you think that you may be pregnant.  If you are breastfeeding. What are the risks? Generally, this is a safe procedure. However, problems may occur, including:  Exposure to radiation (a small amount).  Allergic reaction to the radioactive substance. This is rare.  What happens before the procedure?  Ask your health care provider about changing or stopping your regular medicines. This is especially  important if you are taking diabetes medicines or blood thinners.  Follow your health care provider's instructions about eating or drinking restrictions. What happens during the procedure?  You will be asked to drink a liquid that contains a small amount of a radioactive tracer. This liquid will probably be similar to orange juice.  You will assume a position lying on your back.  A series of images will be taken of your esophagus and upper stomach.  You may be asked to move into different positions to help determine if reflux occurs more often when you are in specific positions.  For adults, an abdominal binder with an inflatable cuff may be placed on the belly (abdomen). This may be used to increase abdominal pressure. More images will be taken to see if the increased pressure causes reflux to occur. The procedure may vary among health care providers and hospitals. What happens after the procedure?  Return to your normal activities and your normal diet as directed by your health care provider.  The radioactive tracer will leave your body over the next few days. Drink enough fluid to keep your urine clear or pale yellow. This will help to flush the tracer out of your body.  It is your responsibility to obtain your test results. Ask your health care provider or the department performing the test when and how you will get your results. This information is not intended to replace advice given to you by your health care provider. Make sure you discuss any questions you have with your health care provider. Document Released: 03/21/2005 Document  Revised: 10/23/2015 Document Reviewed: 11/09/2013 Elsevier Interactive Patient Education  [employer].

## 2016-11-04 NOTE — Progress Notes (Signed)
[facility] Consult Note:  History: Alejandro Rivera 11/04/2016  Referring physician: Timmothy Euler, MD  Reason for consult/chief complaint: Gastroesophageal Reflux (to discuss having an EGD)   Subjective  HPI:  This is a 51 year old man I saw for screening colonoscopy in November 2017. That exam was normal except for diverticulosis. Alejandro Rivera was referred back to [place] for chronic reflux symptoms. The PA who does his health screenings at work asked him to see [place] because they have been prescribing a PPI for the last few years. It has greatly reduced his symptoms of pyrosis, and he says it has changed his life. If he misses even just a few days of it, he will start to have regular episodes of pyrosis. He denies dysphagia, odynophagia, nausea, vomiting, early satiety or weight loss. His bowel habits of an regular without rectal bleeding.  ROS:  Review of Systems  Constitutional: Negative for appetite change and unexpected weight change.  HENT: Negative for mouth sores and voice change.   Eyes: Negative for pain and redness.  Respiratory: Negative for cough and shortness of breath.   Cardiovascular: Negative for chest pain and palpitations.  Genitourinary: Negative for dysuria and hematuria.  Musculoskeletal: Negative for arthralgias and myalgias.  Skin: Negative for pallor and rash.  Neurological: Negative for weakness and headaches.  Hematological: Negative for adenopathy.     Past Medical History: Past Medical History:  Diagnosis Date  . Anxiety   . Depression   . Diverticulitis   . GERD (gastroesophageal reflux disease)   . Hyperlipidemia    no meds needed  . Hypertension      Past Surgical History: Past Surgical History:  Procedure Laterality Date  . CHOLECYSTECTOMY    . LASIK    . WISDOM TOOTH EXTRACTION       Family History: Family History  Problem Relation Age of Onset  . Diabetes Mother   . Cancer Maternal Aunt   . Cancer Maternal  Uncle   . Heart disease Maternal Uncle   . Hypertension Maternal Uncle   . Cancer Paternal Aunt   . Cancer Paternal Uncle   . Heart disease Paternal Uncle   . Hypertension Paternal Uncle   . Arthritis Maternal Grandmother   . Colon cancer Neg Hx   . Esophageal cancer Neg Hx   . Stomach cancer Neg Hx   . Rectal cancer Neg Hx     Social History: Social History   Social History  . Marital status: Single    Spouse name: N/A  . Number of children: N/A  . Years of education: N/A   Social History Main Topics  . Smoking status: Never Smoker  . Smokeless tobacco: Former Systems developer  . Alcohol use 0.0 oz/week     Comment: occasionally  . Drug use: No  . Sexual activity: Yes   Other Topics Concern  . None   Social History Narrative  . None   He is a Personnel officer  Allergies: No Known Allergies  Outpatient Meds: Current Outpatient Prescriptions  Medication Sig Dispense Refill  . allopurinol (ZYLOPRIM) 300 MG tablet Take 300 mg by mouth daily.   0  . IBUPROFEN PO Take by mouth daily as needed.    Marland Kitchen lisinopril-hydrochlorothiazide (PRINZIDE,ZESTORETIC) 20-12.5 MG tablet Take 1 tablet by mouth daily. 90 tablet 3  . Multiple Vitamin (MULTIVITAMIN) tablet Take 1 tablet by mouth daily.    Marland Kitchen omeprazole (PRILOSEC) 40 MG capsule   0  . PARoxetine (PAXIL) 20 MG  tablet TAKE ONE TABLET BY MOUTH ONCE DAILY 90 tablet 0   Current Facility-Administered Medications  Medication Dose Route Frequency Provider Last Rate Last Dose  . 0.9 %  sodium chloride infusion  500 mL Intravenous Continuous Danis, Estill Cotta III, MD          ___________________________________________________________________ Objective   Exam:  BP (!) 136/96 (BP Location: Left Arm, Patient Position: Sitting, Cuff Size: Normal)   Pulse 76   Ht 5' 9.5" (1.765 m) Comment: height measured without shoes  Wt 225 lb 2 oz (102.1 kg)   BMI 32.77 kg/m    General: this is a(n) Obese and well-appearing man,  normal vocal quality  Eyes: sclera anicteric, no redness  ENT: oral mucosa moist without lesions, no cervical or supraclavicular lymphadenopathy, good dentition  CV: RRR without murmur, S1/S2, no JVD, no peripheral edema  Resp: clear to auscultation bilaterally, normal RR and effort noted  GI: soft, no tenderness, with active bowel sounds. No guarding or palpable organomegaly noted.  Skin; warm and dry, no rash or jaundice noted  Neuro: awake, alert and oriented x 3. Normal gross motor function and fluent speech  No data to review  Assessment: Encounter Diagnoses  Name Primary?  . Gastroesophageal reflux disease, esophagitis presence not specified Yes  . Overweight (BMI 25.0-29.9)     Years of GERD symptoms in a middle-aged man without red flag signs. I advised him to have an upper endoscopy to rule out Barrett's esophagus and look for changes of erosive esophagitis or hiatal hernia. I think this would help him understand his options for long-term control. To that end, we also discussed diet and lifestyle measures to help control GERD symptoms. Weight loss would be beneficial to him for multiple health reasons, including his reflux.  He is agreeable to an EGD after discussion of procedure and risks.  The benefits and risks of the planned procedure were described in detail with the patient or (when appropriate) their health care proxy.  Risks were outlined as including, but not limited to, bleeding, infection, perforation, adverse medication reaction leading to cardiac or pulmonary decompensation, or pancreatitis (if ERCP).  The limitation of incomplete mucosal visualization was also discussed.  No guarantees or warranties were given.    Total 25 minute time, over half spent counseling and coordinating care.  Alejandro Rivera  CC: Timmothy Euler, MD

## 2016-12-30 ENCOUNTER — Other Ambulatory Visit: Payer: Self-pay | Admitting: Family Medicine

## 2017-03-28 ENCOUNTER — Other Ambulatory Visit: Payer: Self-pay | Admitting: Family Medicine

## 2017-03-28 ENCOUNTER — Ambulatory Visit: Payer: BLUE CROSS/BLUE SHIELD | Admitting: Family Medicine

## 2017-03-28 ENCOUNTER — Encounter: Payer: Self-pay | Admitting: Family Medicine

## 2017-03-28 VITALS — BP 116/81 | HR 78 | Temp 98.7°F | Ht 69.5 in | Wt 224.4 lb

## 2017-03-28 DIAGNOSIS — M542 Cervicalgia: Secondary | ICD-10-CM | POA: Diagnosis not present

## 2017-03-28 DIAGNOSIS — R7303 Prediabetes: Secondary | ICD-10-CM

## 2017-03-28 DIAGNOSIS — E1169 Type 2 diabetes mellitus with other specified complication: Secondary | ICD-10-CM | POA: Insufficient documentation

## 2017-03-28 MED ORDER — NAPROXEN 500 MG PO TABS: 500.0000 mg | ORAL_TABLET | Freq: Two times a day (BID) | ORAL | 0 refills | Status: DC

## 2017-03-28 MED ORDER — CYCLOBENZAPRINE HCL 10 MG PO TABS: 10.0000 mg | ORAL_TABLET | Freq: Every day | ORAL | 0 refills | Status: DC

## 2017-03-28 NOTE — Progress Notes (Signed)
   HPI  Patient presents today here with neck pain and new diagnosis of prediabetes.  Patient states that an A1c was performed at work and found to be 6.4.  He started metformin 500 mg once daily.  He is also made multiple lifestyle changes.  Patient complains about 1 week of neck pain.  Primarily right-sided paraspinal pain with radiation down to his right shoulder and at times across to the left paraspinal muscles. No change in range of motion. Has had off-and-on left thumb numbness for years.  PMH: Smoking status noted ROS: Per HPI  Objective: BP 116/81   Pulse 78   Temp 98.7 F (37.1 C) (Oral)   Ht 5' 9.5" (1.765 m)   Wt 224 lb 6.4 oz (101.8 kg)   BMI 32.66 kg/m  Gen: NAD, alert, cooperative with exam HEENT: NCAT CV: RRR, good S1/S2, no murmur Resp: CTABL, no wheezes, non-labored Abd: SNTND, BS present, no guarding or organomegaly Ext: No edema, warm Neuro: Alert and oriented, 1+ symmetric biceps and brachioradialis reflexes MSK:  No significant TTP of midline cervical spine or paraspinal muscles  Assessment and plan:  #Neck pain - new dx Likely OA or mild DDD with flare NSAIDs scheduled times 2 weeks plus Flexeril   #Prediabetes Discussed Continue 500 mg metformin, he will follow-up at work for [place], MD [place] 03/28/2017, 3:36 PM

## 2017-03-28 NOTE — Patient Instructions (Addendum)
Great to see you!  Try Flexeril at night, continue naproxen 1 pill twice daily with food for about 2 weeks.  He did not see improvement with these we will go ahead and try x-rays and possibly do physical therapy.  We could also try stronger medicines for pain.

## 2017-03-28 NOTE — Telephone Encounter (Signed)
Last seen 10/16/16  Dr Wendi Snipes

## 2017-04-10 ENCOUNTER — Telehealth: Payer: Self-pay | Admitting: Cardiology

## 2017-04-10 NOTE — Telephone Encounter (Signed)
Numerous attempts to contact patient with recall letters. Unable to reach by telephone. with no success.  08/24/2015 2:18 PM New [10]     [System] 11/12/2015 11:02 PM Notification Sent [20]   Alejandro Rivera [[id number]] 02/27/2016 10:39 AM Notification Sent [20]   Alejandro Rivera [[id number]] 05/10/2016 4:28 PM Notification Sent [20]   Alejandro Rivera [[id number]] 07/05/2016 11:37 AM Notification Sent [20]   Alejandro Rivera [[id number]] 04/10/2017 2:03 PM Notification Sent [20]

## 2017-06-11 DIAGNOSIS — S338XXA Sprain of other parts of lumbar spine and pelvis, initial encounter: Secondary | ICD-10-CM | POA: Diagnosis not present

## 2017-06-11 DIAGNOSIS — S134XXA Sprain of ligaments of cervical spine, initial encounter: Secondary | ICD-10-CM | POA: Diagnosis not present

## 2017-06-11 DIAGNOSIS — S233XXA Sprain of ligaments of thoracic spine, initial encounter: Secondary | ICD-10-CM | POA: Diagnosis not present

## 2017-06-16 DIAGNOSIS — S233XXA Sprain of ligaments of thoracic spine, initial encounter: Secondary | ICD-10-CM | POA: Diagnosis not present

## 2017-06-16 DIAGNOSIS — S338XXA Sprain of other parts of lumbar spine and pelvis, initial encounter: Secondary | ICD-10-CM | POA: Diagnosis not present

## 2017-06-16 DIAGNOSIS — S134XXA Sprain of ligaments of cervical spine, initial encounter: Secondary | ICD-10-CM | POA: Diagnosis not present

## 2017-06-19 DIAGNOSIS — S233XXA Sprain of ligaments of thoracic spine, initial encounter: Secondary | ICD-10-CM | POA: Diagnosis not present

## 2017-06-19 DIAGNOSIS — S134XXA Sprain of ligaments of cervical spine, initial encounter: Secondary | ICD-10-CM | POA: Diagnosis not present

## 2017-06-19 DIAGNOSIS — S338XXA Sprain of other parts of lumbar spine and pelvis, initial encounter: Secondary | ICD-10-CM | POA: Diagnosis not present

## 2017-06-25 ENCOUNTER — Other Ambulatory Visit: Payer: Self-pay | Admitting: Family Medicine

## 2017-06-25 NOTE — Telephone Encounter (Signed)
Last seen 03/28/17  Dr Wendi Snipes

## 2017-07-15 ENCOUNTER — Ambulatory Visit: Payer: BLUE CROSS/BLUE SHIELD | Admitting: Family Medicine

## 2017-07-15 ENCOUNTER — Ambulatory Visit (INDEPENDENT_AMBULATORY_CARE_PROVIDER_SITE_OTHER): Payer: BLUE CROSS/BLUE SHIELD

## 2017-07-15 ENCOUNTER — Other Ambulatory Visit: Payer: Self-pay | Admitting: Family Medicine

## 2017-07-15 ENCOUNTER — Encounter: Payer: Self-pay | Admitting: Family Medicine

## 2017-07-15 VITALS — BP 132/91 | HR 71 | Temp 98.2°F | Ht 69.5 in | Wt 228.2 lb

## 2017-07-15 DIAGNOSIS — R7303 Prediabetes: Secondary | ICD-10-CM | POA: Diagnosis not present

## 2017-07-15 DIAGNOSIS — R200 Anesthesia of skin: Secondary | ICD-10-CM | POA: Diagnosis not present

## 2017-07-15 DIAGNOSIS — R202 Paresthesia of skin: Secondary | ICD-10-CM | POA: Diagnosis not present

## 2017-07-15 DIAGNOSIS — I10 Essential (primary) hypertension: Secondary | ICD-10-CM

## 2017-07-15 DIAGNOSIS — M542 Cervicalgia: Secondary | ICD-10-CM

## 2017-07-15 LAB — BAYER DCA HB A1C WAIVED: HB A1C (BAYER DCA - WAIVED): 5.6 % (ref <7.0)

## 2017-07-15 MED ORDER — TIZANIDINE HCL 4 MG PO TABS: 4.0000 mg | ORAL_TABLET | Freq: Four times a day (QID) | ORAL | 2 refills | Status: DC | PRN

## 2017-07-15 MED ORDER — METFORMIN HCL ER 500 MG PO TB24: 500.0000 mg | ORAL_TABLET | Freq: Every day | ORAL | 3 refills | Status: DC

## 2017-07-15 MED ORDER — ALLOPURINOL 300 MG PO TABS
300.0000 mg | ORAL_TABLET | Freq: Every day | ORAL | 3 refills | Status: DC
Start: 2017-07-15 — End: 2018-07-13

## 2017-07-15 NOTE — Progress Notes (Signed)
   HPI  Patient presents today here for neck pain, prediabetes, HTN, and hand numbness  Pt has had neck pain ion the R side now for 5 to 6 months. He describes right-sided pain of the neck that comes and goes, radiates up to the head at times, down to the right posterior shoulder and anterior to the right anterior neck at times. No aggravating or relieving factors. Flexeril naproxen have not been very helpful.  Patient also has some numbness and tingling of both thumbs off and on.  Patient has been having his prediabetes monitored at work, they no longer have access to the nurse practitioner.  Hypertension Good medication compliance, no headaches or chest pain.   PMH: Smoking status noted ROS: Per HPI  Objective: BP (!) 132/91   Pulse 71   Temp 98.2 F (36.8 C) (Oral)   Ht 5' 9.5" (1.765 m)   Wt 228 lb 3.2 oz (103.5 kg)   BMI 33.22 kg/m  Gen: NAD, alert, cooperative with exam HEENT: NCAT, EOMI, PERRL CV: RRR, good S1/S2, no murmur Resp: CTABL, no wheezes, non-labored Ext: No edema, warm Neuro: Alert and oriented, No gross deficits  Assessment and plan:  #Neck pain Patient with right-sided neck pain with some radiation down to the shoulder. Plain film Trial of tizanidine  #Numbness and tingling in both hands Possible carpal tunnel, right greater than left Phalen's positive today, Tinel's negative #Prediabetes A1c today Previously very well controlled, he is on metformin as well  #Hypertension Well-controlled, no changes    Orders Placed This Encounter  Procedures  . DG Cervical Spine 2 or 3 views    Standing Status:   Future    Standing Expiration Date:   07/15/2018    Order Specific Question:   Reason for Exam (SYMPTOM  OR DIAGNOSIS REQUIRED)    Answer:   neck pain    Order Specific Question:   Preferred imaging location?    Answer:   Internal  . Bayer DCA Hb A1c Waived    Meds ordered this encounter  Medications  . allopurinol (ZYLOPRIM) 300 MG  tablet    Sig: Take 1 tablet (300 mg total) by mouth daily.    Dispense:  90 tablet    Refill:  3  . metFORMIN (GLUCOPHAGE-XR) 500 MG 24 hr tablet    Sig: Take 1 tablet (500 mg total) by mouth daily with breakfast.    Dispense:  90 tablet    Refill:  3  . tiZANidine (ZANAFLEX) 4 MG tablet    Sig: Take 1 tablet (4 mg total) by mouth every 6 (six) hours as needed for muscle spasms.    Dispense:  60 tablet    Refill:  [place], MD [facility] 07/15/2017, 3:28 PM

## 2017-07-16 LAB — CMP14+EGFR
A/G RATIO: 1.8 (ref 1.2–2.2)
ALK PHOS: 77 IU/L (ref 39–117)
ALT: 26 IU/L (ref 0–44)
AST: 22 IU/L (ref 0–40)
Albumin: 4.7 g/dL (ref 3.5–5.5)
BILIRUBIN TOTAL: 0.6 mg/dL (ref 0.0–1.2)
BUN/Creatinine Ratio: 18 (ref 9–20)
BUN: 19 mg/dL (ref 6–24)
CALCIUM: 9.9 mg/dL (ref 8.7–10.2)
CHLORIDE: 99 mmol/L (ref 96–106)
CO2: 25 mmol/L (ref 20–29)
Creatinine, Ser: 1.06 mg/dL (ref 0.76–1.27)
GFR calc Af Amer: 92 mL/min/{1.73_m2} (ref >59)
GFR, EST NON AFRICAN AMERICAN: 80 mL/min/{1.73_m2} (ref >59)
GLOBULIN, TOTAL: 2.6 g/dL (ref 1.5–4.5)
Glucose: 98 mg/dL (ref 65–99)
POTASSIUM: 4.5 mmol/L (ref 3.5–5.2)
SODIUM: 140 mmol/L (ref 134–144)
Total Protein: 7.3 g/dL (ref 6.0–8.5)

## 2017-07-16 LAB — CBC WITH DIFFERENTIAL/PLATELET
BASOS ABS: 0 10*3/uL (ref 0.0–0.2)
BASOS: 0 %
EOS (ABSOLUTE): 0.2 10*3/uL (ref 0.0–0.4)
Eos: 2 %
Hematocrit: 42.8 % (ref 37.5–51.0)
Hemoglobin: 14.6 g/dL (ref 13.0–17.7)
IMMATURE GRANULOCYTES: 0 %
Immature Grans (Abs): 0 10*3/uL (ref 0.0–0.1)
Lymphocytes Absolute: 2.7 10*3/uL (ref 0.7–3.1)
Lymphs: 30 %
MCH: 32.4 pg (ref 26.6–33.0)
MCHC: 34.1 g/dL (ref 31.5–35.7)
MCV: 95 fL (ref 79–97)
MONOS ABS: 0.8 10*3/uL (ref 0.1–0.9)
Monocytes: 9 %
Neutrophils Absolute: 5.3 10*3/uL (ref 1.4–7.0)
Neutrophils: 59 %
PLATELETS: 251 10*3/uL (ref 150–450)
RBC: 4.51 x10E6/uL (ref 4.14–5.80)
RDW: 13.6 % (ref 12.3–15.4)
WBC: 9 10*3/uL (ref 3.4–10.8)

## 2017-07-16 LAB — LIPID PANEL
CHOL/HDL RATIO: 4.4 ratio (ref 0.0–5.0)
CHOLESTEROL TOTAL: 204 mg/dL — AB (ref 100–199)
HDL: 46 mg/dL (ref >39)
LDL CALC: 97 mg/dL (ref 0–99)
TRIGLYCERIDES: 305 mg/dL — AB (ref 0–149)
VLDL Cholesterol Cal: 61 mg/dL — ABNORMAL HIGH (ref 5–40)

## 2017-08-21 ENCOUNTER — Encounter: Payer: Self-pay | Admitting: Family Medicine

## 2017-08-21 ENCOUNTER — Ambulatory Visit: Payer: BLUE CROSS/BLUE SHIELD | Admitting: Family Medicine

## 2017-08-21 VITALS — BP 134/84 | HR 95 | Temp 98.6°F | Ht 69.5 in | Wt 223.6 lb

## 2017-08-21 DIAGNOSIS — L600 Ingrowing nail: Secondary | ICD-10-CM | POA: Diagnosis not present

## 2017-08-21 DIAGNOSIS — L089 Local infection of the skin and subcutaneous tissue, unspecified: Secondary | ICD-10-CM

## 2017-08-21 MED ORDER — CEPHALEXIN 500 MG PO CAPS: 500.0000 mg | ORAL_CAPSULE | Freq: Four times a day (QID) | ORAL | 0 refills | Status: DC

## 2017-08-21 MED ORDER — MUPIROCIN 2 % EX OINT: 1.0000 "application " | TOPICAL_OINTMENT | Freq: Two times a day (BID) | CUTANEOUS | 0 refills | Status: DC

## 2017-08-21 NOTE — Progress Notes (Signed)
   HPI  Patient presents today with 2 separate concerns, possible skin infection on the right foot and ingrown toenail.  Right third toe with erythema that seems to be spreading over the last 6 to 7 days.  Also tenderness to palpation.  Not responding well to Epsom salts soaks. Has had this previously on a great toe but never a small toe.  Patient has had a separate skin lesion on the right dorsal forefoot over the last 5 to 6 days.  Has fever, chills, sweats.  He is tolerating food and fluids like usual  PMH: Smoking status noted ROS: Per HPI  Objective: BP 134/84   Pulse 95   Temp 98.6 F (37 C) (Oral)   Ht 5' 9.5" (1.765 m)   Wt 223 lb 9.6 oz (101.4 kg)   BMI 32.55 kg/m  Gen: NAD, alert, cooperative with exam HEENT: NCAT CV: RRR, good S1/S2, no murmur Resp: CTABL, no wheezes, non-labored Ext: No edema, warm Neuro: Alert and oriented, No gross deficits Skin:  R forefoot with 1.7 cm X 2 cm shallow lesion with surrounding erythema, no induration or drainage. Right third toe with erythema measuring 8 mm x 1.6 mm with yellow crusting and small ingrown toenail  Assessment and plan:  #Ingrown toenail with infection Mupirocin, foot soaks, supportive care discussed Return if not improving in 1 week  #Skin infection Right foot Covering with Keflex and mupirocin Return to clinic with any concerns No signs of systemic infection   Meds ordered this encounter  Medications  . cephALEXin (KEFLEX) 500 MG capsule    Sig: Take 1 capsule (500 mg total) by mouth 4 (four) times daily.    Dispense:  28 capsule    Refill:  0  . mupirocin ointment (BACTROBAN) 2 %    Sig: Apply 1 application topically 2 (two) times daily.    Dispense:  22 g    Refill:  [place], MD [facility] 08/21/2017, 11:22 AM

## 2017-08-21 NOTE — Patient Instructions (Signed)
Great to see you!  Come back or call with any concerns  10 you the foot soaks, gently push back the cuticle with a cotton swab and work in the mupirocin twice a day over the next 3 to 4 days.  Keep the wound on your foot clean and dry.  Be sure to finish all antibiotics

## 2017-12-08 DIAGNOSIS — Z23 Encounter for immunization: Secondary | ICD-10-CM | POA: Diagnosis not present

## 2018-01-15 ENCOUNTER — Telehealth: Payer: Self-pay | Admitting: Family Medicine

## 2018-01-15 DIAGNOSIS — I10 Essential (primary) hypertension: Secondary | ICD-10-CM

## 2018-01-15 MED ORDER — LISINOPRIL-HYDROCHLOROTHIAZIDE 20-12.5 MG PO TABS: 1.0000 | ORAL_TABLET | Freq: Every day | ORAL | 0 refills | Status: DC

## 2018-01-15 NOTE — Telephone Encounter (Signed)
What is the name of the medication? lisinopril-hydrochlorothiazide (PRINZIDE,ZESTORETIC) 20-12.5 MG tablet  Have you contacted your pharmacy to request a refill? Yes but did not see in chart  Which pharmacy would you like this sent to? [facility]   Patient notified that their request is being sent to the clinical staff for review and that they should receive a call once it is complete. If they do not receive a call within 24 hours they can check with their pharmacy or our office.

## 2018-01-15 NOTE — Telephone Encounter (Signed)
Patient aware that he needs to be seen. Appointment given and rx refilled for 30days.

## 2018-01-28 ENCOUNTER — Ambulatory Visit: Payer: BLUE CROSS/BLUE SHIELD | Admitting: Family Medicine

## 2018-01-28 ENCOUNTER — Encounter: Payer: Self-pay | Admitting: Family Medicine

## 2018-01-28 VITALS — BP 140/91 | HR 74 | Temp 98.1°F | Ht 69.5 in | Wt 229.0 lb

## 2018-01-28 DIAGNOSIS — R7303 Prediabetes: Secondary | ICD-10-CM | POA: Diagnosis not present

## 2018-01-28 DIAGNOSIS — I10 Essential (primary) hypertension: Secondary | ICD-10-CM | POA: Diagnosis not present

## 2018-01-28 DIAGNOSIS — E663 Overweight: Secondary | ICD-10-CM | POA: Diagnosis not present

## 2018-01-28 DIAGNOSIS — F411 Generalized anxiety disorder: Secondary | ICD-10-CM

## 2018-01-28 DIAGNOSIS — K219 Gastro-esophageal reflux disease without esophagitis: Secondary | ICD-10-CM

## 2018-01-28 LAB — BAYER DCA HB A1C WAIVED: HB A1C (BAYER DCA - WAIVED): 6.5 % (ref <7.0)

## 2018-01-28 MED ORDER — TIZANIDINE HCL 4 MG PO TABS: 4.0000 mg | ORAL_TABLET | Freq: Four times a day (QID) | ORAL | 2 refills | Status: DC | PRN

## 2018-01-28 MED ORDER — OMEPRAZOLE 40 MG PO CPDR: 40.0000 mg | DELAYED_RELEASE_CAPSULE | Freq: Every day | ORAL | 3 refills | Status: DC

## 2018-01-28 MED ORDER — LISINOPRIL-HYDROCHLOROTHIAZIDE 20-12.5 MG PO TABS: 1.0000 | ORAL_TABLET | Freq: Every day | ORAL | 3 refills | Status: DC

## 2018-01-28 MED ORDER — PAROXETINE HCL 20 MG PO TABS: 20.0000 mg | ORAL_TABLET | Freq: Every day | ORAL | 3 refills | Status: DC

## 2018-01-28 NOTE — Progress Notes (Signed)
BP (!) 140/91   Pulse 74   Temp 98.1 F (36.7 C) (Oral)   Ht 5' 9.5" (1.765 m)   Wt 229 lb (103.9 kg)   BMI 33.33 kg/m    Subjective:    Patient ID: Alejandro Rivera, male    DOB: [birth year], 52 y.o.   MRN: [id number]  HPI: Alejandro Rivera is a 52 y.o. male presenting on 01/28/2018 for Hypertension (check up of chronic medical conditions)   HPI Hypertension Patient is currently on lisinopril hydrochlorothiazide, and their blood pressure today is 140/91 and he says at home it runs in the high 130s over 80s to the 140s over 90s.. Patient denies any lightheadedness or dizziness. Patient denies headaches, blurred vision, chest pains, shortness of breath, or weakness. Denies any side effects from medication and is content with current medication.   Prediabetes  Patient comes in today for recheck of his diabetes. Patient has been currently taking metformin. Patient is currently on an ACE inhibitor/ARB. Patient has not seen an ophthalmologist this year. Patient denies any issues with their feet.   GERD Patient is currently on omeprazole, had tried to come off of it but it did not go well so would like to continue it and is doing great well on it.  She denies any major symptoms or abdominal pain or belching or burping. She denies any blood in her stool or lightheadedness or dizziness.   Anxiety recheck Patient is coming in for anxiety recheck and has been on Paxil and says it is still working great for him he denies any major issues.  Patient denies any suicidal ideations or thoughts of hurting himself.  He has been right contention his mood has been doing great. Depression screen [facility] 2/9 01/28/2018 08/21/2017 07/15/2017 03/28/2017 10/16/2016  Decreased Interest 0 0 0 0 0  Down, Depressed, Hopeless 0 0 0 0 0  PHQ - 2 Score 0 0 0 0 0     Relevant past medical, surgical, family and social history reviewed and updated as indicated. Interim medical history since our last visit reviewed. Allergies  and medications reviewed and updated.  Review of Systems  Constitutional: Negative for chills and fever.  Eyes: Negative for visual disturbance.  Respiratory: Negative for shortness of breath and wheezing.   Cardiovascular: Negative for chest pain and leg swelling.  Musculoskeletal: Negative for back pain and gait problem.  Skin: Negative for rash.  Neurological: Negative for dizziness, weakness, light-headedness and numbness.  Psychiatric/Behavioral: Negative for dysphoric mood, self-injury, sleep disturbance and suicidal ideas. The patient is not nervous/anxious.   All other systems reviewed and are negative.   Per HPI unless specifically indicated above   Allergies as of 01/28/2018   No Known Allergies     Medication List       Accurate as of January 28, 2018  4:23 PM. Always use your most recent med list.        allopurinol 300 MG tablet Commonly known as:  ZYLOPRIM Take 1 tablet (300 mg total) by mouth daily.   IBUPROFEN PO Take by mouth daily as needed.   lisinopril-hydrochlorothiazide 20-12.5 MG tablet Commonly known as:  PRINZIDE,ZESTORETIC Take 1 tablet by mouth daily.   metFORMIN 500 MG 24 hr tablet Commonly known as:  GLUCOPHAGE-XR Take 1 tablet (500 mg total) by mouth daily with breakfast.   multivitamin tablet Take 1 tablet by mouth daily.   mupirocin ointment 2 % Commonly known as:  BACTROBAN Apply 1 application topically 2 (two) times daily.  omeprazole 40 MG capsule Commonly known as:  PRILOSEC Take 1 capsule (40 mg total) by mouth daily.   PARoxetine 20 MG tablet Commonly known as:  PAXIL Take 1 tablet (20 mg total) by mouth daily.   tiZANidine 4 MG tablet Commonly known as:  ZANAFLEX Take 1 tablet (4 mg total) by mouth every 6 (six) hours as needed for muscle spasms.          Objective:    BP (!) 140/91   Pulse 74   Temp 98.1 F (36.7 C) (Oral)   Ht 5' 9.5" (1.765 m)   Wt 229 lb (103.9 kg)   BMI 33.33 kg/m   Wt Readings  from Last 3 Encounters:  01/28/18 229 lb (103.9 kg)  08/21/17 223 lb 9.6 oz (101.4 kg)  07/15/17 228 lb 3.2 oz (103.5 kg)    Physical Exam Vitals signs and nursing note reviewed.  Constitutional:      General: He is not in acute distress.    Appearance: He is well-developed. He is not diaphoretic.  Eyes:     General: No scleral icterus.    Conjunctiva/sclera: Conjunctivae normal.  Neck:     Musculoskeletal: Neck supple.     Thyroid: No thyromegaly.  Cardiovascular:     Rate and Rhythm: Normal rate and regular rhythm.     Heart sounds: Normal heart sounds. No murmur.  Pulmonary:     Effort: Pulmonary effort is normal. No respiratory distress.     Breath sounds: Normal breath sounds. No wheezing.  Musculoskeletal: Normal range of motion.  Lymphadenopathy:     Cervical: No cervical adenopathy.  Skin:    General: Skin is warm and dry.     Findings: No rash.  Neurological:     Mental Status: He is alert and oriented to person, place, and time.     Coordination: Coordination normal.  Psychiatric:        Behavior: Behavior normal.         Assessment & Plan:   Problem List Items Addressed This Visit      Cardiovascular and Mediastinum   Essential hypertension   Relevant Medications   lisinopril-hydrochlorothiazide (PRINZIDE,ZESTORETIC) 20-12.5 MG tablet   Other Relevant Orders   Lipid panel   CMP14+EGFR     Digestive   GERD (gastroesophageal reflux disease) - Primary   Relevant Medications   omeprazole (PRILOSEC) 40 MG capsule   Other Relevant Orders   CMP14+EGFR     Other   Overweight (BMI 25.0-29.9)   Relevant Orders   Lipid panel   GAD (generalized anxiety disorder)   Relevant Medications   PARoxetine (PAXIL) 20 MG tablet   Prediabetes   Relevant Orders   Bayer DCA Hb A1c Waived   Lipid panel   CMP14+EGFR       Follow up plan: Return in about 6 months (around 07/30/2018), or if symptoms worsen or fail to improve, for Hypertension  anxiety.  Counseling provided for all of the vaccine components Orders Placed This Encounter  Procedures  . Bayer DCA Hb A1c Waived  . Lipid panel  . Worthington Dettinger, MD [facility] 01/28/2018, 4:23 PM

## 2018-01-29 LAB — CMP14+EGFR
A/G RATIO: 1.5 (ref 1.2–2.2)
ALT: 28 IU/L (ref 0–44)
AST: 24 IU/L (ref 0–40)
Albumin: 4.1 g/dL (ref 3.5–5.5)
Alkaline Phosphatase: 90 IU/L (ref 39–117)
BUN/Creatinine Ratio: 14 (ref 9–20)
BUN: 15 mg/dL (ref 6–24)
Bilirubin Total: 0.2 mg/dL (ref 0.0–1.2)
CO2: 23 mmol/L (ref 20–29)
Calcium: 9.7 mg/dL (ref 8.7–10.2)
Chloride: 98 mmol/L (ref 96–106)
Creatinine, Ser: 1.05 mg/dL (ref 0.76–1.27)
GFR calc Af Amer: 93 mL/min/{1.73_m2} (ref >59)
GFR, EST NON AFRICAN AMERICAN: 81 mL/min/{1.73_m2} (ref >59)
Globulin, Total: 2.7 g/dL (ref 1.5–4.5)
Glucose: 131 mg/dL — ABNORMAL HIGH (ref 65–99)
POTASSIUM: 4.1 mmol/L (ref 3.5–5.2)
Sodium: 135 mmol/L (ref 134–144)
Total Protein: 6.8 g/dL (ref 6.0–8.5)

## 2018-01-29 LAB — LIPID PANEL
CHOL/HDL RATIO: 5 ratio (ref 0.0–5.0)
CHOLESTEROL TOTAL: 176 mg/dL (ref 100–199)
HDL: 35 mg/dL — ABNORMAL LOW (ref >39)
TRIGLYCERIDES: 507 mg/dL — AB (ref 0–149)

## 2018-03-05 DIAGNOSIS — M1711 Unilateral primary osteoarthritis, right knee: Secondary | ICD-10-CM | POA: Diagnosis not present

## 2018-03-05 DIAGNOSIS — M25561 Pain in right knee: Secondary | ICD-10-CM | POA: Diagnosis not present

## 2018-05-11 IMAGING — DX DG CHEST 2V
2 series · 2 of 2 positions shown · non-contrast
Comparison: None.

CLINICAL DATA: Chest pain, elevated blood pressure

EXAM:
CHEST  2 VIEW

[chest pa]
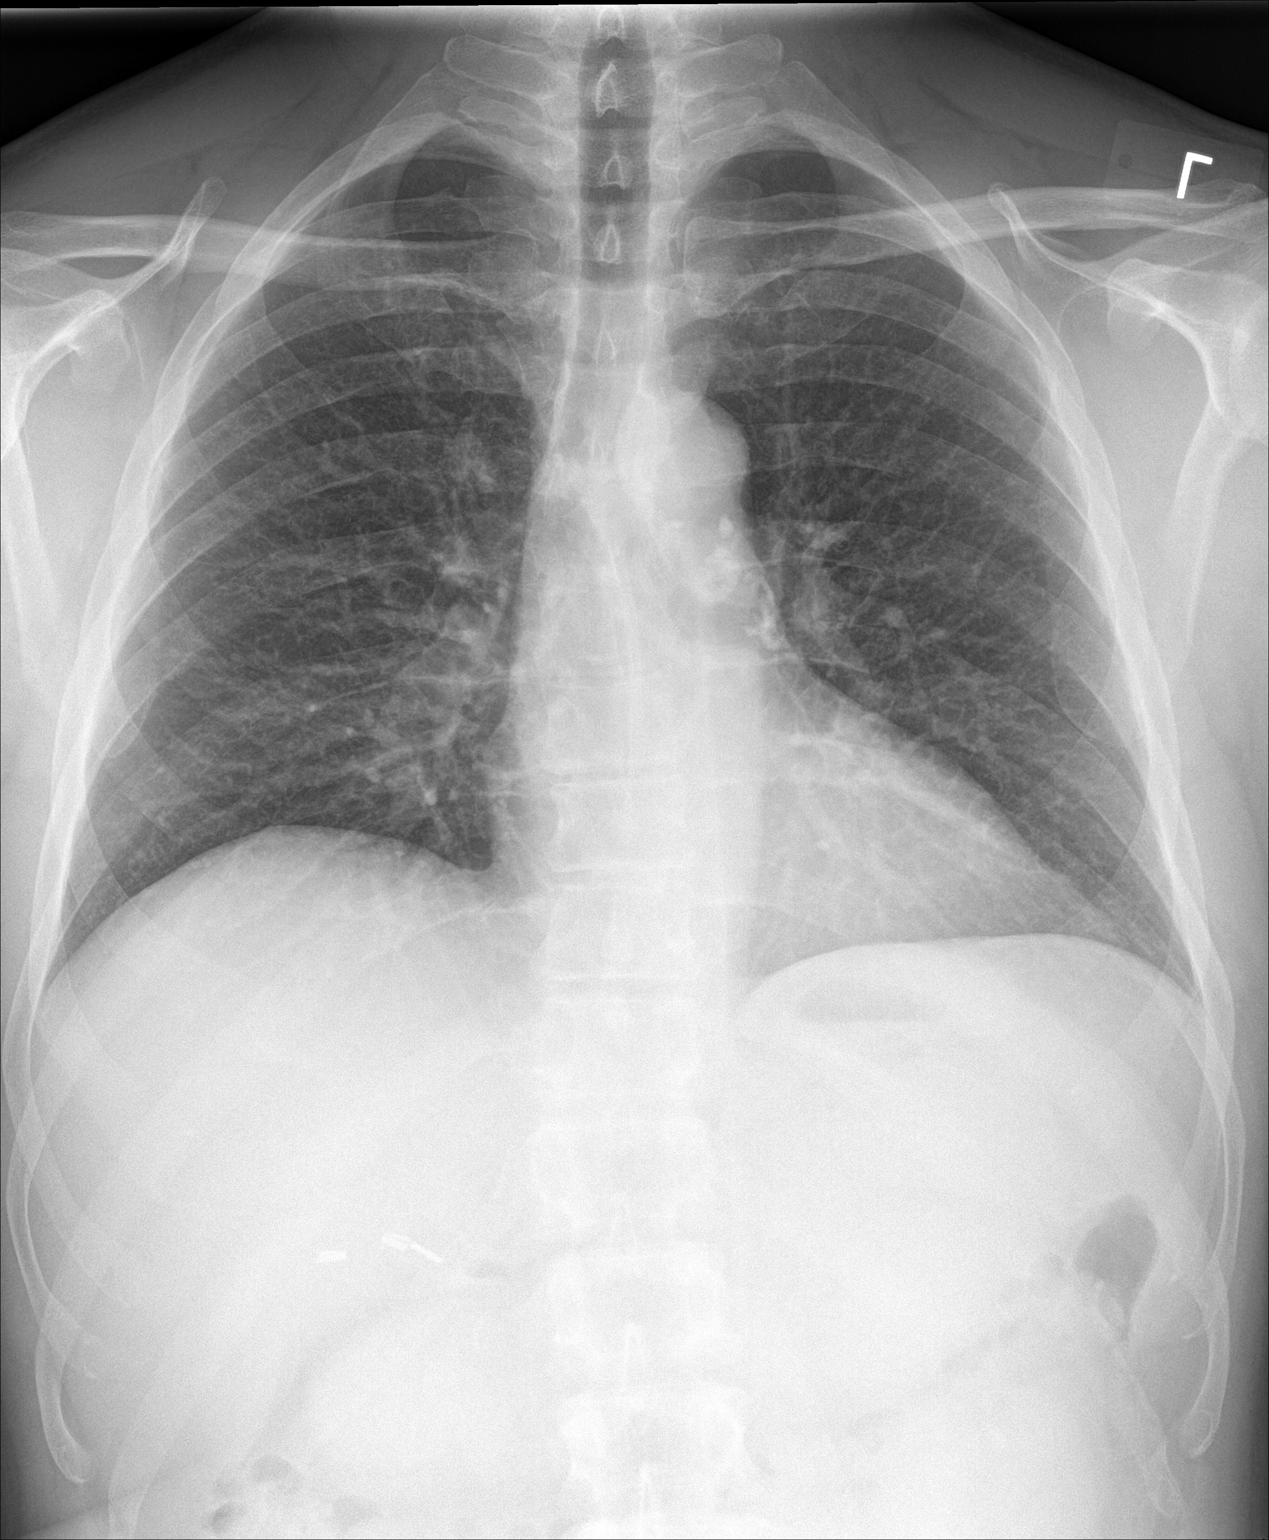

[chest lat]
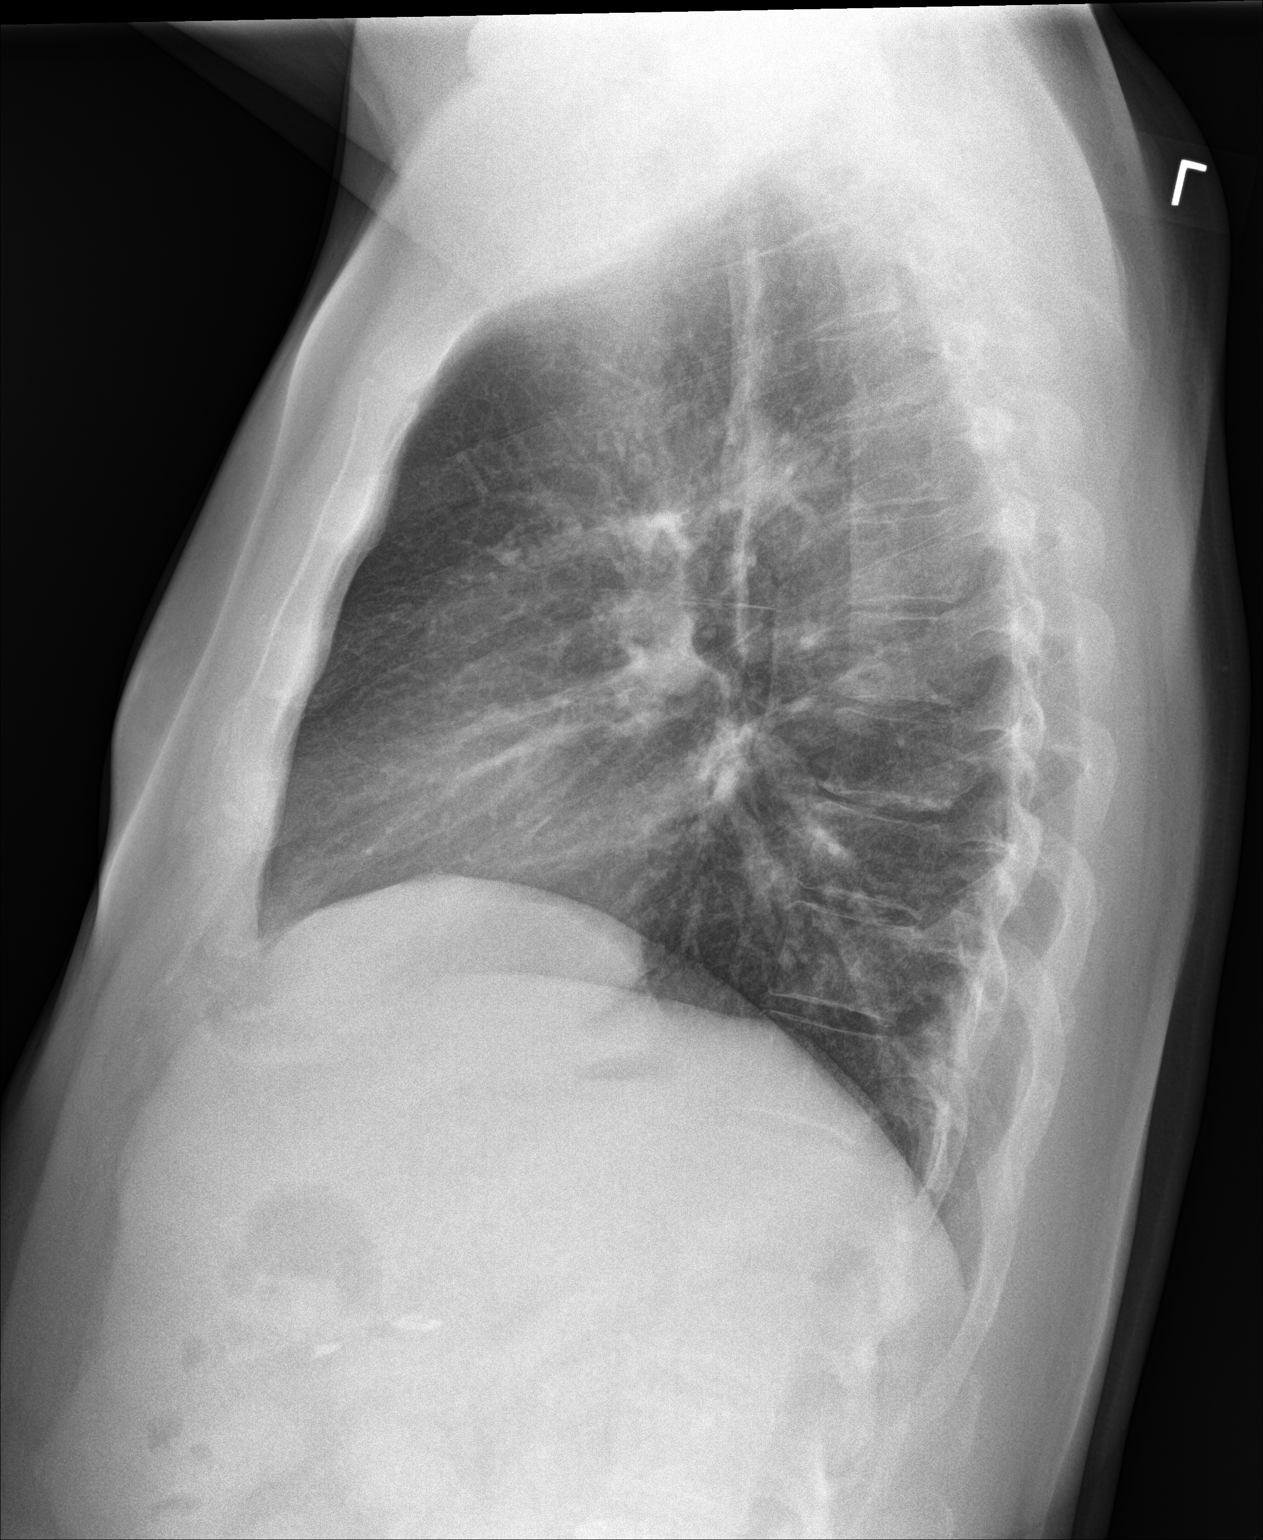

[2 of 2 positions shown; findings below may reference images not displayed]

FINDINGS: Cardiomediastinal silhouette is unremarkable. No acute infiltrate or
pleural effusion. No pulmonary edema. Mild elevation of the right
hemidiaphragm. Postcholecystectomy surgical clips are noted.
IMPRESSION: No active cardiopulmonary disease.

## 2018-07-13 ENCOUNTER — Other Ambulatory Visit: Payer: Self-pay | Admitting: *Deleted

## 2018-07-13 MED ORDER — ALLOPURINOL 300 MG PO TABS: 300.0000 mg | ORAL_TABLET | Freq: Every day | ORAL | 0 refills | Status: DC

## 2018-07-24 ENCOUNTER — Telehealth: Payer: Self-pay | Admitting: *Deleted

## 2018-07-24 NOTE — Telephone Encounter (Signed)
Patient aware and verbalizes understanding. 

## 2018-07-24 NOTE — Telephone Encounter (Signed)
Fax from [facility] Metformin ER 500 mg has been recalled Can Rx be changed to regular Metformin Please advise

## 2018-07-24 NOTE — Telephone Encounter (Signed)
Yes we can change in for now, please notify the patient that we had to make this change and why we had to make this change and that we would get back to the other soon as we can if he develops stomach issues.

## 2018-07-30 ENCOUNTER — Telehealth: Payer: Self-pay | Admitting: Family Medicine

## 2018-07-30 ENCOUNTER — Ambulatory Visit: Payer: BLUE CROSS/BLUE SHIELD | Admitting: Family Medicine

## 2018-07-30 MED ORDER — METFORMIN HCL 500 MG PO TABS: 500.0000 mg | ORAL_TABLET | Freq: Two times a day (BID) | ORAL | 0 refills | Status: DC

## 2018-07-30 NOTE — Telephone Encounter (Signed)
Sent metformin normal

## 2018-08-12 ENCOUNTER — Ambulatory Visit: Payer: BLUE CROSS/BLUE SHIELD | Admitting: Family Medicine

## 2018-08-29 ENCOUNTER — Other Ambulatory Visit: Payer: Self-pay | Admitting: Family Medicine

## 2018-09-04 ENCOUNTER — Other Ambulatory Visit: Payer: Self-pay

## 2018-09-07 ENCOUNTER — Ambulatory Visit: Payer: BLUE CROSS/BLUE SHIELD | Admitting: Family Medicine

## 2018-09-07 ENCOUNTER — Encounter: Payer: Self-pay | Admitting: Family Medicine

## 2018-09-07 ENCOUNTER — Other Ambulatory Visit: Payer: Self-pay

## 2018-09-07 VITALS — BP 142/97 | HR 77 | Temp 98.6°F | Ht 69.5 in | Wt 228.6 lb

## 2018-09-07 DIAGNOSIS — I10 Essential (primary) hypertension: Secondary | ICD-10-CM | POA: Diagnosis not present

## 2018-09-07 DIAGNOSIS — E663 Overweight: Secondary | ICD-10-CM | POA: Diagnosis not present

## 2018-09-07 DIAGNOSIS — K219 Gastro-esophageal reflux disease without esophagitis: Secondary | ICD-10-CM

## 2018-09-07 DIAGNOSIS — F411 Generalized anxiety disorder: Secondary | ICD-10-CM

## 2018-09-07 DIAGNOSIS — R7303 Prediabetes: Secondary | ICD-10-CM | POA: Diagnosis not present

## 2018-09-07 LAB — BAYER DCA HB A1C WAIVED: HB A1C (BAYER DCA - WAIVED): 6.4 % (ref <7.0)

## 2018-09-07 MED ORDER — LISINOPRIL-HYDROCHLOROTHIAZIDE 20-25 MG PO TABS: 1.0000 | ORAL_TABLET | Freq: Every day | ORAL | 3 refills | Status: DC

## 2018-09-07 MED ORDER — METFORMIN HCL 500 MG PO TABS: 500.0000 mg | ORAL_TABLET | Freq: Two times a day (BID) | ORAL | 3 refills | Status: DC

## 2018-09-07 MED ORDER — ALLOPURINOL 300 MG PO TABS: 300.0000 mg | ORAL_TABLET | Freq: Every day | ORAL | 1 refills | Status: DC

## 2018-09-07 NOTE — Progress Notes (Signed)
BP (!) 142/97   Pulse 77   Temp 98.6 F (37 C) (Temporal)   Ht 5' 9.5" (1.765 m)   Wt 228 lb 9.6 oz (103.7 kg)   BMI 33.27 kg/m    Subjective:   Patient ID: Alejandro Rivera, male    DOB: Oct 16, 1965, 53 y.o.   MRN: [id number]  HPI: Alejandro Rivera is a 53 y.o. male presenting on 09/07/2018 for Diabetes and Hypertension   HPI Prediabetes Patient comes in today for recheck of his diabetes. Patient has been currently taking metformin. Patient is currently on an ACE inhibitor/ARB. Patient has not seen an ophthalmologist this year. Patient denies any issues with their feet.   Hypertension Patient is currently on lisinopril hydrochlorothiazide, and their blood pressure today is 142/97 and he said at home it mostly runs in the 140s over 80s as well. Patient denies any lightheadedness or dizziness. Patient denies headaches, blurred vision, chest pains, shortness of breath, or weakness. Denies any side effects from medication and is content with current medication.   Hyperlipidemia Patient is coming in for recheck of his hyperlipidemia. The patient is currently taking no medication currently. They deny any issues with myalgias or history of liver damage from it. They deny any focal numbness or weakness or chest pain.   Patient has gout and takes allopurinol and has been doing well and denies any major issues with it.  GERD Patient is currently on omeprazole.  She denies any major symptoms or abdominal pain or belching or burping. She denies any blood in her stool or lightheadedness or dizziness.   Relevant past medical, surgical, family and social history reviewed and updated as indicated. Interim medical history since our last visit reviewed. Allergies and medications reviewed and updated.  Review of Systems  Constitutional: Negative for chills and fever.  Eyes: Negative for visual disturbance.  Respiratory: Negative for shortness of breath and wheezing.   Cardiovascular: Negative for  chest pain and leg swelling.  Musculoskeletal: Negative for back pain and gait problem.  Skin: Negative for rash.  Neurological: Negative for dizziness, weakness and light-headedness.  All other systems reviewed and are negative.   Per HPI unless specifically indicated above   Allergies as of 09/07/2018   No Known Allergies     Medication List       Accurate as of September 07, 2018  4:07 PM. If you have any questions, ask your nurse or doctor.        STOP taking these medications   lisinopril-hydrochlorothiazide 20-12.5 MG tablet Commonly known as: ZESTORETIC Replaced by: lisinopril-hydrochlorothiazide 20-25 MG tablet Stopped by: Fransisca Kaufmann Dettinger, MD   tiZANidine 4 MG tablet Commonly known as: ZANAFLEX Stopped by: Fransisca Kaufmann Dettinger, MD     TAKE these medications   allopurinol 300 MG tablet Commonly known as: ZYLOPRIM Take 1 tablet (300 mg total) by mouth daily.   IBUPROFEN PO Take by mouth daily as needed.   lisinopril-hydrochlorothiazide 20-25 MG tablet Commonly known as: ZESTORETIC Take 1 tablet by mouth daily. Replaces: lisinopril-hydrochlorothiazide 20-12.5 MG tablet Started by: Fransisca Kaufmann Dettinger, MD   metFORMIN 500 MG tablet Commonly known as: GLUCOPHAGE Take 1 tablet (500 mg total) by mouth 2 (two) times daily with a meal. What changed:   See the new instructions.  Another medication with the same name was removed. Continue taking this medication, and follow the directions you see here. Changed by: Fransisca Kaufmann Dettinger, MD   multivitamin tablet Take 1 tablet by mouth daily.  mupirocin ointment 2 % Commonly known as: Bactroban Apply 1 application topically 2 (two) times daily.   omeprazole 40 MG capsule Commonly known as: PRILOSEC Take 1 capsule (40 mg total) by mouth daily.   PARoxetine 20 MG tablet Commonly known as: PAXIL Take 1 tablet (20 mg total) by mouth daily.        Objective:   BP (!) 142/97   Pulse 77   Temp 98.6 F (37 C)  (Temporal)   Ht 5' 9.5" (1.765 m)   Wt 228 lb 9.6 oz (103.7 kg)   BMI 33.27 kg/m   Wt Readings from Last 3 Encounters:  09/07/18 228 lb 9.6 oz (103.7 kg)  01/28/18 229 lb (103.9 kg)  08/21/17 223 lb 9.6 oz (101.4 kg)    Physical Exam Vitals signs and nursing note reviewed.  Constitutional:      General: He is not in acute distress.    Appearance: He is well-developed. He is not diaphoretic.  Eyes:     General: No scleral icterus.    Conjunctiva/sclera: Conjunctivae normal.  Neck:     Musculoskeletal: Neck supple.     Thyroid: No thyromegaly.  Cardiovascular:     Rate and Rhythm: Normal rate and regular rhythm.     Heart sounds: Normal heart sounds. No murmur.  Pulmonary:     Effort: Pulmonary effort is normal. No respiratory distress.     Breath sounds: Normal breath sounds. No wheezing.  Abdominal:     General: Abdomen is flat. Bowel sounds are normal. There is no distension.     Tenderness: There is no abdominal tenderness. There is no guarding or rebound.  Musculoskeletal: Normal range of motion.  Lymphadenopathy:     Cervical: No cervical adenopathy.  Skin:    General: Skin is warm and dry.     Findings: No rash.  Neurological:     Mental Status: He is alert and oriented to person, place, and time.     Coordination: Coordination normal.  Psychiatric:        Behavior: Behavior normal.       Assessment & Plan:   Problem List Items Addressed This Visit      Cardiovascular and Mediastinum   Essential hypertension - Primary   Relevant Medications   lisinopril-hydrochlorothiazide (ZESTORETIC) 20-25 MG tablet   Other Relevant Orders   CMP14+EGFR     Digestive   GERD (gastroesophageal reflux disease)   Relevant Orders   CBC with Differential/Platelet     Other   Overweight (BMI 25.0-29.9)   Relevant Orders   Lipid panel   GAD (generalized anxiety disorder)   Relevant Orders   CBC with Differential/Platelet   Prediabetes   Relevant Orders   CMP14+EGFR    Bayer DCA Hb A1c Waived      Continue metformin  Increase blood pressure medication from lisinopril HCTZ 20-12.5 up to 20-25  Patient is low to moderate risk for cardiac disease at 8.5%, will reassess for statin in the future  The 10-year ASCVD risk score Mikey Bussing DC Brooke Bonito., et al., 2013) is: 8.5%   Values used to calculate the score:     Age: 39 years     Sex: Male     Is Non-Hispanic African American: No     Diabetic: No     Tobacco smoker: No     Systolic Blood Pressure: 147 mmHg     Is BP treated: Yes     HDL Cholesterol: 35 mg/dL     Total  Cholesterol: 176 mg/dL  Follow up plan: Return in about 6 months (around 03/10/2019), or if symptoms worsen or fail to improve, for hypertension and diabetes.  Counseling provided for all of the vaccine components Orders Placed This Encounter  Procedures  . CBC with Differential/Platelet  . CMP14+EGFR  . Bayer DCA Hb A1c Waived  . Lipid panel    Caryl Pina, MD [facility] 09/07/2018, 4:07 PM

## 2018-09-08 LAB — CMP14+EGFR
ALT: 28 IU/L (ref 0–44)
AST: 20 IU/L (ref 0–40)
Albumin/Globulin Ratio: 1.8 (ref 1.2–2.2)
Albumin: 4.6 g/dL (ref 3.8–4.9)
Alkaline Phosphatase: 82 IU/L (ref 39–117)
BUN/Creatinine Ratio: 16 (ref 9–20)
BUN: 18 mg/dL (ref 6–24)
Bilirubin Total: 0.5 mg/dL (ref 0.0–1.2)
CO2: 23 mmol/L (ref 20–29)
Calcium: 9.9 mg/dL (ref 8.7–10.2)
Chloride: 99 mmol/L (ref 96–106)
Creatinine, Ser: 1.16 mg/dL (ref 0.76–1.27)
GFR calc Af Amer: 82 mL/min/{1.73_m2} (ref >59)
GFR calc non Af Amer: 71 mL/min/{1.73_m2} (ref >59)
Globulin, Total: 2.6 g/dL (ref 1.5–4.5)
Glucose: 105 mg/dL — ABNORMAL HIGH (ref 65–99)
Potassium: 4.3 mmol/L (ref 3.5–5.2)
Sodium: 139 mmol/L (ref 134–144)
Total Protein: 7.2 g/dL (ref 6.0–8.5)

## 2018-09-08 LAB — CBC WITH DIFFERENTIAL/PLATELET
Basophils Absolute: 0.1 10*3/uL (ref 0.0–0.2)
Basos: 1 %
EOS (ABSOLUTE): 0.2 10*3/uL (ref 0.0–0.4)
Eos: 2 %
Hematocrit: 42.9 % (ref 37.5–51.0)
Hemoglobin: 14.7 g/dL (ref 13.0–17.7)
Immature Grans (Abs): 0 10*3/uL (ref 0.0–0.1)
Immature Granulocytes: 0 %
Lymphocytes Absolute: 2.9 10*3/uL (ref 0.7–3.1)
Lymphs: 28 %
MCH: 32.2 pg (ref 26.6–33.0)
MCHC: 34.3 g/dL (ref 31.5–35.7)
MCV: 94 fL (ref 79–97)
Monocytes Absolute: 1 10*3/uL — ABNORMAL HIGH (ref 0.1–0.9)
Monocytes: 9 %
Neutrophils Absolute: 6.2 10*3/uL (ref 1.4–7.0)
Neutrophils: 60 %
Platelets: 243 10*3/uL (ref 150–450)
RBC: 4.57 x10E6/uL (ref 4.14–5.80)
RDW: 12.6 % (ref 11.6–15.4)
WBC: 10.3 10*3/uL (ref 3.4–10.8)

## 2018-09-08 LAB — LIPID PANEL
Chol/HDL Ratio: 5.6 ratio — ABNORMAL HIGH (ref 0.0–5.0)
Cholesterol, Total: 223 mg/dL — ABNORMAL HIGH (ref 100–199)
HDL: 40 mg/dL (ref >39)
Triglycerides: 491 mg/dL — ABNORMAL HIGH (ref 0–149)

## 2018-09-10 ENCOUNTER — Other Ambulatory Visit: Payer: Self-pay | Admitting: Family Medicine

## 2018-09-10 ENCOUNTER — Telehealth: Payer: Self-pay | Admitting: Family Medicine

## 2018-09-10 MED ORDER — PRAVASTATIN SODIUM 20 MG PO TABS: 20.0000 mg | ORAL_TABLET | Freq: Every day | ORAL | 3 refills | Status: DC

## 2018-09-10 NOTE — Progress Notes (Signed)
Sent pravastatin for the patient because cholesterol is high

## 2018-09-10 NOTE — Telephone Encounter (Signed)
Aware of lab results  

## 2018-11-09 DIAGNOSIS — B029 Zoster without complications: Secondary | ICD-10-CM | POA: Diagnosis not present

## 2018-11-09 DIAGNOSIS — Z6832 Body mass index (BMI) 32.0-32.9, adult: Secondary | ICD-10-CM | POA: Diagnosis not present

## 2019-02-27 ENCOUNTER — Other Ambulatory Visit: Payer: Self-pay | Admitting: Family Medicine

## 2019-03-10 ENCOUNTER — Ambulatory Visit (INDEPENDENT_AMBULATORY_CARE_PROVIDER_SITE_OTHER): Payer: BC Managed Care – PPO | Admitting: Family Medicine

## 2019-03-10 ENCOUNTER — Encounter: Payer: Self-pay | Admitting: Family Medicine

## 2019-03-10 DIAGNOSIS — I10 Essential (primary) hypertension: Secondary | ICD-10-CM | POA: Diagnosis not present

## 2019-03-10 DIAGNOSIS — K219 Gastro-esophageal reflux disease without esophagitis: Secondary | ICD-10-CM

## 2019-03-10 DIAGNOSIS — E1169 Type 2 diabetes mellitus with other specified complication: Secondary | ICD-10-CM

## 2019-03-10 DIAGNOSIS — F411 Generalized anxiety disorder: Secondary | ICD-10-CM

## 2019-03-10 DIAGNOSIS — R7303 Prediabetes: Secondary | ICD-10-CM | POA: Diagnosis not present

## 2019-03-10 DIAGNOSIS — E785 Hyperlipidemia, unspecified: Secondary | ICD-10-CM

## 2019-03-10 MED ORDER — METFORMIN HCL 500 MG PO TABS: 500.0000 mg | ORAL_TABLET | Freq: Two times a day (BID) | ORAL | 3 refills | Status: DC

## 2019-03-10 MED ORDER — OMEPRAZOLE 40 MG PO CPDR: 40.0000 mg | DELAYED_RELEASE_CAPSULE | Freq: Every day | ORAL | 3 refills | Status: DC

## 2019-03-10 MED ORDER — ALLOPURINOL 300 MG PO TABS: 300.0000 mg | ORAL_TABLET | Freq: Every day | ORAL | 3 refills | Status: DC

## 2019-03-10 MED ORDER — PAROXETINE HCL 20 MG PO TABS: 20.0000 mg | ORAL_TABLET | Freq: Every day | ORAL | 3 refills | Status: DC

## 2019-03-10 MED ORDER — LISINOPRIL-HYDROCHLOROTHIAZIDE 20-25 MG PO TABS: 1.0000 | ORAL_TABLET | Freq: Every day | ORAL | 3 refills | Status: DC

## 2019-03-10 MED ORDER — PRAVASTATIN SODIUM 20 MG PO TABS: 20.0000 mg | ORAL_TABLET | Freq: Every day | ORAL | 3 refills | Status: DC

## 2019-03-10 NOTE — Progress Notes (Signed)
Virtual Visit via telephone Note  I connected with Alejandro Rivera on 03/10/19 at 1516 by telephone and verified that I am speaking with the correct person using two identifiers. Alejandro Rivera is currently located at home and no other people are currently with her during visit. The provider, Alejandro Kaufmann Samra Pesch, MD is located in their office at time of visit.  Call ended at 1529  I discussed the limitations, risks, security and privacy concerns of performing an evaluation and management service by telephone and the availability of in person appointments. I also discussed with the patient that there may be a patient responsible charge related to this service. The patient expressed understanding and agreed to proceed.   History and Present Illness: Prediabetes Patient comes in today for recheck of his diabetes. Patient has been currently taking metformin. Patient is currently on an ACE inhibitor/ARB. Patient has not seen an ophthalmologist this year. Patient denies any issues with their feet.   GERD Patient is currently on omeprazole.  She denies any major symptoms or abdominal pain or belching or burping. She denies any blood in her stool or lightheadedness or dizziness.   Hypertension Patient is currently on lisinopril-hctz, and their blood pressure today is 140/90. Patient denies any lightheadedness or dizziness. Patient denies headaches, blurred vision, chest pains, shortness of breath, or weakness. Denies any side effects from medication and is content with current medication.   Anxiety  Patient is currently taking paroxetine 20 and feels like the anxiety is under control.  He denies any suicidal ideations or thought of hurting self.  Hyperlipidemia Patient is coming in for recheck of his hyperlipidemia. The patient is currently taking pravastatin. They deny any issues with myalgias or history of liver damage from it. They deny any focal numbness or weakness or chest pain.   1.  Prediabetes   2. Gastroesophageal reflux disease, unspecified whether esophagitis present   3. Essential hypertension   4. GAD (generalized anxiety disorder)   5. Hyperlipidemia associated with type 2 diabetes mellitus [facility])     Outpatient Encounter Medications as of 03/10/2019  Medication Sig  . allopurinol (ZYLOPRIM) 300 MG tablet Take 1 tablet (300 mg total) by mouth daily.  . IBUPROFEN PO Take by mouth daily as needed.  Marland Kitchen lisinopril-hydrochlorothiazide (ZESTORETIC) 20-25 MG tablet Take 1 tablet by mouth daily.  . metFORMIN (GLUCOPHAGE) 500 MG tablet Take 1 tablet (500 mg total) by mouth 2 (two) times daily with a meal.  . Multiple Vitamin (MULTIVITAMIN) tablet Take 1 tablet by mouth daily.  Marland Kitchen omeprazole (PRILOSEC) 40 MG capsule Take 1 capsule (40 mg total) by mouth daily.  Marland Kitchen PARoxetine (PAXIL) 20 MG tablet Take 1 tablet (20 mg total) by mouth daily.  . pravastatin (PRAVACHOL) 20 MG tablet Take 1 tablet (20 mg total) by mouth daily.  . [DISCONTINUED] allopurinol (ZYLOPRIM) 300 MG tablet Take 1 tablet (300 mg total) by mouth daily.  . [DISCONTINUED] lisinopril-hydrochlorothiazide (ZESTORETIC) 20-25 MG tablet Take 1 tablet by mouth daily.  . [DISCONTINUED] metFORMIN (GLUCOPHAGE) 500 MG tablet Take 1 tablet (500 mg total) by mouth 2 (two) times daily with a meal.  . [DISCONTINUED] mupirocin ointment (BACTROBAN) 2 % Apply 1 application topically 2 (two) times daily.  . [DISCONTINUED] omeprazole (PRILOSEC) 40 MG capsule Take 1 capsule by mouth once daily  . [DISCONTINUED] PARoxetine (PAXIL) 20 MG tablet Take 1 tablet (20 mg total) by mouth daily.  . [DISCONTINUED] pravastatin (PRAVACHOL) 20 MG tablet Take 1 tablet (20 mg total) by mouth  daily.   Facility-Administered Encounter Medications as of 03/10/2019  Medication  . 0.9 %  sodium chloride infusion    Review of Systems  Constitutional: Negative for chills and fever.  Eyes: Negative for visual disturbance.  Respiratory: Negative for  shortness of breath and wheezing.   Cardiovascular: Negative for chest pain and leg swelling.  Musculoskeletal: Negative for back pain and gait problem.  Skin: Negative for rash.  Psychiatric/Behavioral: Positive for sleep disturbance. Negative for self-injury and suicidal ideas. The patient is not hyperactive.   All other systems reviewed and are negative.   Observations/Objective: Patient sounds comfortable and in no acute distress  Assessment and Plan: Problem List Items Addressed This Visit      Cardiovascular and Mediastinum   Essential hypertension   Relevant Medications   pravastatin (PRAVACHOL) 20 MG tablet   lisinopril-hydrochlorothiazide (ZESTORETIC) 20-25 MG tablet     Digestive   GERD (gastroesophageal reflux disease)   Relevant Medications   omeprazole (PRILOSEC) 40 MG capsule   Other Relevant Orders   CBC with Differential/Platelet     Other   GAD (generalized anxiety disorder)   Relevant Medications   PARoxetine (PAXIL) 20 MG tablet   Prediabetes - Primary   Relevant Medications   metFORMIN (GLUCOPHAGE) 500 MG tablet   Other Relevant Orders   CMP14+EGFR   Bayer DCA Hb A1c Waived    Other Visit Diagnoses    Hyperlipidemia associated with type 2 diabetes mellitus ([place])       Relevant Medications   pravastatin (PRAVACHOL) 20 MG tablet   metFORMIN (GLUCOPHAGE) 500 MG tablet   lisinopril-hydrochlorothiazide (ZESTORETIC) 20-25 MG tablet   Other Relevant Orders   Lipid panel      Continue current medication and no major changes at this point, it seems like he is doing well and will await blood work that he is going to come in and do.  We will see where he is out from there. Follow up plan: Return in about 6 months (around 09/07/2019), or if symptoms worsen or fail to improve, for prediabetes and hld.     I discussed the assessment and treatment plan with the patient. The patient was provided an opportunity to ask questions and all were answered. The  patient agreed with the plan and demonstrated an understanding of the instructions.   The patient was advised to call back or seek an in-person evaluation if the symptoms worsen or if the condition fails to improve as anticipated.  The above assessment and management plan was discussed with the patient. The patient verbalized understanding of and has agreed to the management plan. Patient is aware to call the clinic if symptoms persist or worsen. Patient is aware when to return to the clinic for a follow-up visit. Patient educated on when it is appropriate to go to the emergency department.    I provided 13 minutes of non-face-to-face time during this encounter.    Worthy Rancher, MD

## 2019-03-23 ENCOUNTER — Other Ambulatory Visit: Payer: Self-pay

## 2019-03-23 ENCOUNTER — Other Ambulatory Visit: Payer: BC Managed Care – PPO

## 2019-03-23 DIAGNOSIS — R7303 Prediabetes: Secondary | ICD-10-CM | POA: Diagnosis not present

## 2019-03-23 DIAGNOSIS — E785 Hyperlipidemia, unspecified: Secondary | ICD-10-CM

## 2019-03-23 DIAGNOSIS — E1169 Type 2 diabetes mellitus with other specified complication: Secondary | ICD-10-CM | POA: Diagnosis not present

## 2019-03-23 DIAGNOSIS — K219 Gastro-esophageal reflux disease without esophagitis: Secondary | ICD-10-CM | POA: Diagnosis not present

## 2019-03-23 LAB — BAYER DCA HB A1C WAIVED: HB A1C (BAYER DCA - WAIVED): 8.1 % — ABNORMAL HIGH (ref <7.0)

## 2019-03-24 LAB — CBC WITH DIFFERENTIAL/PLATELET
Basophils Absolute: 0.1 10*3/uL (ref 0.0–0.2)
Basos: 1 %
EOS (ABSOLUTE): 0.2 10*3/uL (ref 0.0–0.4)
Eos: 2 %
Hematocrit: 46.1 % (ref 37.5–51.0)
Hemoglobin: 15.8 g/dL (ref 13.0–17.7)
Immature Grans (Abs): 0 10*3/uL (ref 0.0–0.1)
Immature Granulocytes: 0 %
Lymphocytes Absolute: 2.1 10*3/uL (ref 0.7–3.1)
Lymphs: 25 %
MCH: 32.5 pg (ref 26.6–33.0)
MCHC: 34.3 g/dL (ref 31.5–35.7)
MCV: 95 fL (ref 79–97)
Monocytes Absolute: 0.7 10*3/uL (ref 0.1–0.9)
Monocytes: 8 %
Neutrophils Absolute: 5.5 10*3/uL (ref 1.4–7.0)
Neutrophils: 64 %
Platelets: 252 10*3/uL (ref 150–450)
RBC: 4.86 x10E6/uL (ref 4.14–5.80)
RDW: 12 % (ref 11.6–15.4)
WBC: 8.5 10*3/uL (ref 3.4–10.8)

## 2019-03-24 LAB — CMP14+EGFR
ALT: 36 IU/L (ref 0–44)
AST: 36 IU/L (ref 0–40)
Albumin/Globulin Ratio: 1.7 (ref 1.2–2.2)
Albumin: 4.7 g/dL (ref 3.8–4.9)
Alkaline Phosphatase: 81 IU/L (ref 39–117)
BUN/Creatinine Ratio: 16 (ref 9–20)
BUN: 16 mg/dL (ref 6–24)
Bilirubin Total: 0.6 mg/dL (ref 0.0–1.2)
CO2: 20 mmol/L (ref 20–29)
Calcium: 9.7 mg/dL (ref 8.7–10.2)
Chloride: 96 mmol/L (ref 96–106)
Creatinine, Ser: 1 mg/dL (ref 0.76–1.27)
GFR calc Af Amer: 98 mL/min/{1.73_m2} (ref >59)
GFR calc non Af Amer: 84 mL/min/{1.73_m2} (ref >59)
Globulin, Total: 2.8 g/dL (ref 1.5–4.5)
Glucose: 200 mg/dL — ABNORMAL HIGH (ref 65–99)
Potassium: 4.4 mmol/L (ref 3.5–5.2)
Sodium: 136 mmol/L (ref 134–144)
Total Protein: 7.5 g/dL (ref 6.0–8.5)

## 2019-03-24 LAB — LIPID PANEL
Chol/HDL Ratio: 5.2 ratio — ABNORMAL HIGH (ref 0.0–5.0)
Cholesterol, Total: 204 mg/dL — ABNORMAL HIGH (ref 100–199)
HDL: 39 mg/dL — ABNORMAL LOW (ref >39)
LDL Chol Calc (NIH): 89 mg/dL (ref 0–99)
Triglycerides: 466 mg/dL — ABNORMAL HIGH (ref 0–149)
VLDL Cholesterol Cal: 76 mg/dL — ABNORMAL HIGH (ref 5–40)

## 2019-03-26 ENCOUNTER — Telehealth: Payer: Self-pay | Admitting: Family Medicine

## 2019-03-26 NOTE — Telephone Encounter (Signed)
Pt called back stating that he received a missed call from [place] regarding his lab results. Went over results with pt per Dr Neldon Mc notes and scheduled pt a 3 mth check up. Pt is aware so focus on his diet and take medications as directed.

## 2019-04-03 ENCOUNTER — Other Ambulatory Visit: Payer: Self-pay | Admitting: Family Medicine

## 2019-05-13 ENCOUNTER — Encounter: Payer: Self-pay | Admitting: Family Medicine

## 2019-05-13 ENCOUNTER — Ambulatory Visit: Payer: BC Managed Care – PPO | Admitting: Family Medicine

## 2019-05-13 ENCOUNTER — Other Ambulatory Visit: Payer: Self-pay

## 2019-05-13 VITALS — BP 125/86 | HR 66 | Temp 99.4°F | Wt 220.0 lb

## 2019-05-13 DIAGNOSIS — N481 Balanitis: Secondary | ICD-10-CM | POA: Diagnosis not present

## 2019-05-13 DIAGNOSIS — T819XXA Unspecified complication of procedure, initial encounter: Secondary | ICD-10-CM

## 2019-05-13 DIAGNOSIS — R39198 Other difficulties with micturition: Secondary | ICD-10-CM | POA: Diagnosis not present

## 2019-05-13 DIAGNOSIS — Z9889 Other specified postprocedural states: Secondary | ICD-10-CM

## 2019-05-13 LAB — URINALYSIS, COMPLETE
Bilirubin, UA: NEGATIVE
Glucose, UA: NEGATIVE
Ketones, UA: NEGATIVE
Nitrite, UA: NEGATIVE
Protein,UA: NEGATIVE
Specific Gravity, UA: 1.025 (ref 1.005–1.030)
Urobilinogen, Ur: 0.2 mg/dL (ref 0.2–1.0)
pH, UA: 6 (ref 5.0–7.5)

## 2019-05-13 LAB — MICROSCOPIC EXAMINATION
Bacteria, UA: NONE SEEN
Epithelial Cells (non renal): NONE SEEN /hpf (ref 0–10)
Renal Epithel, UA: NONE SEEN /hpf

## 2019-05-13 MED ORDER — CLOTRIMAZOLE-BETAMETHASONE 1-0.05 % EX CREA: 1.0000 "application " | TOPICAL_CREAM | Freq: Two times a day (BID) | CUTANEOUS | 0 refills | Status: DC

## 2019-05-13 NOTE — Progress Notes (Signed)
Subjective: CC: Dysuria PCP: Dettinger, Fransisca Kaufmann, MD NP:[id number] [place] is a 54 y.o. male presenting to clinic today for:  1.  Dysuria Patient reports that he had a couple day history of dysuria.  He describes this as burning externally when he urinates.  He notes he has had some swelling near the end of his penis.  This has been an intermittent issue for years.  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  He has difficulty retracting it as of late and feels that his urinary stream has been weaker than normal (it normally is a lower flow).  He denies any hematuria, pelvic pain, nausea, vomiting, fevers.  He is in a monogamous relationship with his wife, whom he married in November.  He does report that he has some pain with intercourse and this has been present for years.  He admits to being very reluctant to discuss these issues as he is very self-conscious about them.  He is not seen a urologist since he was a child but would be interested in seeing 1 now.   ROS: Per HPI  No Known Allergies Past Medical History:  Diagnosis Date  . Anxiety   . Depression   . Diverticulitis   . GERD (gastroesophageal reflux disease)   . Hyperlipidemia    no meds needed  . Hypertension     Current Outpatient Medications:  .  allopurinol (ZYLOPRIM) 300 MG tablet, Take 1 tablet by mouth once daily, Disp: 90 tablet, Rfl: 0 .  IBUPROFEN PO, Take by mouth daily as needed., Disp: , Rfl:  .  lisinopril-hydrochlorothiazide (ZESTORETIC) 20-25 MG tablet, Take 1 tablet by mouth daily., Disp: 90 tablet, Rfl: 3 .  metFORMIN (GLUCOPHAGE) 500 MG tablet, Take 1 tablet (500 mg total) by mouth 2 (two) times daily with a meal., Disp: 180 tablet, Rfl: 3 .  Multiple Vitamin (MULTIVITAMIN) tablet, Take 1 tablet by mouth daily., Disp: , Rfl:  .   omeprazole (PRILOSEC) 40 MG capsule, Take 1 capsule (40 mg total) by mouth daily., Disp: 90 capsule, Rfl: 3 .  PARoxetine (PAXIL) 20 MG tablet, Take 1 tablet (20 mg total) by mouth daily., Disp: 90 tablet, Rfl: 3 .  pravastatin (PRAVACHOL) 20 MG tablet, Take 1 tablet (20 mg total) by mouth daily., Disp: 90 tablet, Rfl: 3  Current Facility-Administered Medications:  .  0.9 %  sodium chloride infusion, 500 mL, Intravenous, Continuous, Danis, Kirke Corin, MD Social History   Socioeconomic History  . Marital status: Single    Spouse name: Not on file  . Number of children: Not on file  . Years of education: Not on file  . Highest education level: Not on file  Occupational History  . Not on file  Tobacco Use  . Smoking status: Never Smoker  . Smokeless tobacco: Former Network engineer and Sexual Activity  . Alcohol use: Yes    Alcohol/week: 0.0 standard drinks    Comment: occasionally  . Drug use: No  . Sexual activity: Yes  Other Topics Concern  . Not on file  Social History Narrative  . Not on file   Social Determinants of Health   Financial Resource Strain:   . Difficulty of Paying Living Expenses:   Food Insecurity:   . Worried About Estate manager/land agent  of Food in the Last Year:   . [place] in the Last Year:   Transportation Needs:   . Lack of Transportation (Medical):   Marland Kitchen Lack of Transportation (Non-Medical):   Physical Activity:   . Days of Exercise per Week:   . Minutes of Exercise per Session:   Stress:   . Feeling of Stress :   Social Connections:   . Frequency of Communication with Friends and Family:   . Frequency of Social Gatherings with Friends and Family:   . Attends Religious Services:   . Active Member of Clubs or Organizations:   . Attends Archivist Meetings:   Marland Kitchen Marital Status:   Intimate Partner Violence:   . Fear of Current or Ex-Partner:   . Emotionally Abused:   Marland Kitchen Physically Abused:   . Sexually Abused:    Family History  Problem  Relation Age of Onset  . Diabetes Mother   . Cancer Maternal Aunt   . Cancer Maternal Uncle   . Heart disease Maternal Uncle   . Hypertension Maternal Uncle   . Cancer Paternal Aunt   . Cancer Paternal Uncle   . Heart disease Paternal Uncle   . Hypertension Paternal Uncle   . Arthritis Maternal Grandmother   . Colon cancer Neg Hx   . Esophageal cancer Neg Hx   . Stomach cancer Neg Hx   . Rectal cancer Neg Hx     Objective: Office vital signs reviewed. BP 125/86   Pulse 66   Temp 99.4 F (37.4 C) (Temporal)   Wt 220 lb (99.8 kg)   SpO2 98%   BMI 32.02 kg/m   Physical Examination:  General: Awake, alert, well appearing, No acute distress GU: Soft tissue swelling noted along the skin surrounding the edge of the glans of the penis.  There is a slight white creamy discharge noted.  He has a fissure noted at the 12 o'clock position of the penis.  This is hemostatic.  There is also redundant skin noted along the 8 o'clock position at the edge of the glans.  He has difficulty retracting the foreskin secondary to swelling and pain.  Assessment/ Plan: 54 y.o. male   1. Difficulty urinating Urinalysis with white blood cells and red blood cells but microscopy with no bacteria.  I suspect that the symptoms are actually related to the swelling.  Since warmth seems to help with reducing the swelling and pain I have encouraged him to use warm compresses in this area.  I have also gone ahead and put him on clotrimazole/betamethasone cream to apply twice daily for the next 7 to 10 days.  Urgent referral to urology placed.  I do think that patient would likely benefit from revision surgery but will defer to their expertise on the appropriateness of this procedure.  We discussed red flag signs and symptoms warranting further evaluation emergency department.  Encouraged him to contact me if any other questions or concerns arise. - Urinalysis, Complete - Ambulatory referral to Urology  2.  Balanitis - clotrimazole-betamethasone (LOTRISONE) cream; Apply 1 application topically 2 (two) times daily. x7-10 days  Dispense: 30 g; Refill: 0 - Ambulatory referral to Urology  3. History of testicular surgery - Ambulatory referral to Urology  4. Complication of circumcision, initial encounter - Ambulatory referral to Urology   No orders of the defined types were placed in this encounter.  No orders of the defined types were placed in this encounter.    Tiah Heckel  [place], [place] [phone]

## 2019-05-13 NOTE — Patient Instructions (Signed)
Balanitis  Balanitis is swelling and irritation (inflammation) of the head of the penis (glans penis). The condition may also cause inflammation of the skin around the glans penis (foreskin) in men who have not been circumcised. It may develop because of an infection or another medical condition. Balanitis occurs most often among men who have not had their foreskin removed (uncircumcised men). Balanitis sometimes causes scarring of the penis or foreskin, which can require surgery. Untreated balanitis can increase the risk of penile cancer. What are the causes? Common causes of this condition include:  Poor personal hygiene, especially in uncircumcised men. Not cleaning the glans penis and foreskin well can result in buildup of bacteria, viruses, and yeast, which can lead to infection and inflammation.  Irritation and lack of air flow due to fluid (smegma) that can build up on the glans penis. Other causes include:  Chemical irritation from products such as soaps or shower gels (especially those that have fragrance), condoms, personal lubricants, petroleum jelly, spermicides, or fabric softeners.  Skin conditions, such as eczema, dermatitis, and psoriasis.  Allergies to medicines, such as tetracycline and sulfa drugs.  Certain medical conditions, including liver cirrhosis, congestive heart failure, diabetes, and kidney disease.  Infections, such as candidiasis, HPV (human papillomavirus), herpes simplex, gonorrhea, and syphilis.  Severe obesity. What increases the risk? The following factors may make you more likely to develop this condition:  Having diabetes. This is the most common risk factor.  Having a tight foreskin that is difficult to pull back (retract) past the glans.  Having sexual intercourse without using a condom. What are the signs or symptoms? Symptoms of this condition include:  Discharge from under the foreskin.  A bad smell.  Pain or difficulty retracting the  foreskin.  Tenderness, redness, and swelling of the glans.  A rash or sores on the glans or foreskin.  Itchiness.  Inability to get an erection due to pain.  Difficulty urinating.  Scarring of the penis or foreskin, in some cases. How is this diagnosed? This condition may be diagnosed based on:  A physical exam.  Testing a swab of discharge to check for bacterial or fungal infection.  Blood tests: ? To check for viruses that can cause balanitis. ? To check your blood sugar (glucose) level. High blood glucose could be a sign of diabetes, which can cause balanitis. How is this treated? Treatment for balanitis depends on the cause. Treatment may include:  Improving personal hygiene. Your health care provider may recommend sitting in a bath of warm water that is deep enough to cover your hips and buttocks (sitz bath).  Medicines such as: ? Creams or ointments to reduce swelling (steroids) or to treat an infection. ? Antibiotic medicine. ? Antifungal medicine.  Surgery to remove or cut the foreskin (circumcision). This may be done if you have scarring on the foreskin that makes it difficult to retract.  Controlling other medical problems that may be causing your condition or making it worse. Follow these instructions at home:  Do not have sex until the condition clears up, or until your health care provider approves.  Keep your penis clean and dry. Take sitz baths as recommended by your health care provider.  Avoid products that irritate your skin or make symptoms worse, such as soaps and shower gels that have fragrance.  Take over-the-counter and prescription medicines only as told by your health care provider. ? If you were prescribed an antibiotic medicine or a cream or ointment, use it as   told by your health care provider. Do not stop using your medicine, cream, or ointment even if you start to feel better. ? Do not drive or use heavy machinery while taking prescription  pain medicine. Contact a health care provider if:  Your symptoms get worse or do not improve with home care.  You develop chills or a fever.  You have trouble urinating.  You cannot retract your foreskin. Get help right away if:  You develop severe pain.  You are unable to urinate. Summary  Balanitis is inflammation of the head of the penis (glans penis) caused by irritation or infection.  Balanitis causes pain, redness, and swelling of the glans penis.  This condition is most common among uncircumcised men who do not keep their glans penis clean and in men who have diabetes.  Treatment may include creams or ointments.  Good hygiene is important for prevention. This includes pulling back the foreskin when washing your penis. This information is not intended to replace advice given to you by your health care provider. Make sure you discuss any questions you have with your health care provider. Document Revised: 01/10/2017 Document Reviewed: 12/18/2015 Elsevier Patient Education  2020 Elsevier Inc.  

## 2019-05-20 DIAGNOSIS — Z23 Encounter for immunization: Secondary | ICD-10-CM | POA: Diagnosis not present

## 2019-06-15 ENCOUNTER — Other Ambulatory Visit: Payer: Self-pay | Admitting: Urology

## 2019-06-15 DIAGNOSIS — N481 Balanitis: Secondary | ICD-10-CM | POA: Diagnosis not present

## 2019-06-15 DIAGNOSIS — N475 Adhesions of prepuce and glans penis: Secondary | ICD-10-CM | POA: Diagnosis not present

## 2019-06-15 DIAGNOSIS — Z125 Encounter for screening for malignant neoplasm of prostate: Secondary | ICD-10-CM | POA: Diagnosis not present

## 2019-06-15 DIAGNOSIS — N35811 Other urethral stricture, male, meatal: Secondary | ICD-10-CM | POA: Diagnosis not present

## 2019-06-16 ENCOUNTER — Encounter (HOSPITAL_BASED_OUTPATIENT_CLINIC_OR_DEPARTMENT_OTHER): Payer: Self-pay | Admitting: Urology

## 2019-06-16 ENCOUNTER — Other Ambulatory Visit: Payer: Self-pay

## 2019-06-16 NOTE — Progress Notes (Signed)
Spoke w/ via phone for pre-op interview--- Alejandro Rivera needs dos---- ISTAT 8, EKG.             Lab results------ COVID test ------ 5/8 @01200  Arrive at -------0830 NPO after ------MN Medications to take morning of surgery ----- Prilosec, Paxil and Zyloprim. Diabetic medication ----- NONE Patient Special Instructions ----- Pre-Op special Istructions ----- Patient verbalized understanding of instructions that were given at this phone interview. Patient denies shortness of breath, chest pain, fever, cough a this phone interview.

## 2019-06-18 ENCOUNTER — Other Ambulatory Visit: Payer: Self-pay

## 2019-06-18 ENCOUNTER — Encounter: Payer: Self-pay | Admitting: Family Medicine

## 2019-06-18 ENCOUNTER — Ambulatory Visit: Payer: BC Managed Care – PPO | Admitting: Family Medicine

## 2019-06-18 VITALS — BP 119/82 | HR 65 | Temp 98.2°F | Ht 70.0 in | Wt 219.4 lb

## 2019-06-18 DIAGNOSIS — E1169 Type 2 diabetes mellitus with other specified complication: Secondary | ICD-10-CM

## 2019-06-18 DIAGNOSIS — K219 Gastro-esophageal reflux disease without esophagitis: Secondary | ICD-10-CM | POA: Diagnosis not present

## 2019-06-18 DIAGNOSIS — E785 Hyperlipidemia, unspecified: Secondary | ICD-10-CM

## 2019-06-18 DIAGNOSIS — F411 Generalized anxiety disorder: Secondary | ICD-10-CM

## 2019-06-18 DIAGNOSIS — I10 Essential (primary) hypertension: Secondary | ICD-10-CM | POA: Diagnosis not present

## 2019-06-18 DIAGNOSIS — Z23 Encounter for immunization: Secondary | ICD-10-CM | POA: Diagnosis not present

## 2019-06-18 LAB — BAYER DCA HB A1C WAIVED: HB A1C (BAYER DCA - WAIVED): 7.1 % — ABNORMAL HIGH (ref <7.0)

## 2019-06-18 NOTE — Progress Notes (Signed)
BP 119/82   Pulse 65   Temp 98.2 F (36.8 C) (Temporal)   Ht 5\' 10"  (1.778 m)   Wt 219 lb 6 oz (99.5 kg)   BMI 31.48 kg/m    Subjective:   Patient ID: Alejandro Rivera, male    DOB: 25-Dec-1965, 54 y.o.   MRN: [id number]  HPI: Alejandro Rivera is a 54 y.o. male presenting on 06/18/2019 for follow up on T2DM, HTN, Hyperlipidemia, and Anxiety.  1. T2DM:  Alejandro Rivera is taking Metformin 500 mg BID for his T2DM. He is checking his blood sugar at home and states that the lowest values in the morning has been 135 and the highest value in the morning has been 215. Usually the range is between 150-200 in the morning. Alejandro Rivera has made some significant diet changes recently, including reducing portion sizes, cutting out a lot of fatty/fried foods, and cutting back on sodas. He reports a 7 pound weight loss since his last visit. Alejandro Rivera is due for his annual diabetic eye exam and says that he needs to schedule this appointment.  2. HTN:  Alejandro Rivera is taking Lisinopril-HCTZ for his HTN. He does check his BP at home but not as regularly. His values at home are usually in the 120s/low 80s. BP in clinic today was 119/82.  3. Hyperlipidemia:  Alejandro Rivera is taking Pravastatin 20 mg QD for his Hyperlipidemia. He denies any muscle aches.  4. Anxiety:   Alejandro Rivera reports that his mood has been good. He is taking Paroxetine 20 mg QD. He denies any increased worries or feeling down. He recently got remarried and that has been great for him.  5. Health Maintenance:  Alejandro Rivera is due for his Tdap vaccine today and also is requesting the Shingles vaccine.  Relevant past medical, surgical, family and social history reviewed and updated as indicated. Interim medical history since our last visit reviewed.  Allergies and medications reviewed and updated.  Review of Systems  Constitutional: Negative for fatigue and unexpected weight change.  Eyes: Negative for visual disturbance.   Negative for blurry vision.  Gastrointestinal: Negative for constipation, diarrhea, nausea and vomiting.  Genitourinary: Positive for dysuria (Painful urination increased over the past few months; has procedure scheduled with Urology next week.). Negative for hematuria.  Musculoskeletal: Negative for myalgias.  Neurological: Negative for light-headedness.       Positive for tingling in hands/feet.  Psychiatric/Behavioral: The patient is not nervous/anxious.    Per HPI unless specifically indicated above  Allergies as of 06/18/2019   No Known Allergies     Medication List       Accurate as of Jun 18, 2019 11:59 PM. If you have any questions, ask your nurse or doctor.        STOP taking these medications   clotrimazole-betamethasone cream Commonly known as: Lotrisone Stopped by: Worthy Rancher, MD     TAKE these medications   allopurinol 300 MG tablet Commonly known as: ZYLOPRIM Take 1 tablet by mouth once daily   HYDROcodone-acetaminophen 5-325 MG tablet Commonly known as: Norco Take 1 tablet by mouth every 4 (four) hours as needed for moderate pain.   IBUPROFEN PO Take by mouth daily as needed.   lisinopril-hydrochlorothiazide 20-25 MG tablet Commonly known as: ZESTORETIC Take 1 tablet by mouth daily.   metFORMIN 500 MG tablet Commonly known as: GLUCOPHAGE Take 1 tablet (500 mg total) by mouth 2 (two) times daily with a meal.   multivitamin  tablet Take 1 tablet by mouth daily.   omeprazole 40 MG capsule Commonly known as: PRILOSEC Take 1 capsule (40 mg total) by mouth daily.   PARoxetine 20 MG tablet Commonly known as: PAXIL Take 1 tablet (20 mg total) by mouth daily.   pravastatin 20 MG tablet Commonly known as: PRAVACHOL Take 1 tablet (20 mg total) by mouth daily.        Objective:   BP 119/82   Pulse 65   Temp 98.2 F (36.8 C) (Temporal)   Ht 5\' 10"  (1.778 m)   Wt 219 lb 6 oz (99.5 kg)   BMI 31.48 kg/m   Wt Readings from Last 3  Encounters:  06/23/19 215 lb 6.4 oz (97.7 kg)  06/18/19 219 lb 6 oz (99.5 kg)  05/13/19 220 lb (99.8 kg)    Physical Exam Constitutional:      General: He is not in acute distress.    Appearance: Normal appearance.  Cardiovascular:     Rate and Rhythm: Normal rate and regular rhythm.     Comments: S1/S2 auscultated. Radial and DP pulses palpated bilaterally. Pulmonary:     Effort: Pulmonary effort is normal.     Breath sounds: Normal breath sounds. No wheezing, rhonchi or rales.  Skin:    General: Skin is warm and dry.  Neurological:     Mental Status: He is alert and oriented to person, place, and time.     Coordination: Coordination normal.     Gait: Gait normal.  Psychiatric:        Mood and Affect: Mood normal.        Behavior: Behavior normal.        Thought Content: Thought content normal.        Judgment: Judgment normal.    Assessment & Plan:   Problem List Items Addressed This Visit      Cardiovascular and Mediastinum   Essential hypertension     Digestive   GERD (gastroesophageal reflux disease)     Endocrine   Type 2 diabetes mellitus with other specified complication ([place])     Other   GAD (generalized anxiety disorder)    Other Visit Diagnoses    Hyperlipidemia associated with type 2 diabetes mellitus ([place])    -  Primary   Relevant Orders   Microalbumin / creatinine urine ratio (Completed)   Bayer DCA Hb A1c Waived (Completed)      1. T2DM:  Continue taking Metformin 500 mg BID. Also continue with dietary improvements. A1C in clinic today is 7.1, which is down from last visit's A1C of 8.1. If Alejandro Rivera's A1C continues to go down and reaches the low 6's, will consider backing off on the Metformin medication. Will check Microalbumin/Creatinine urine ratio today. Alejandro Rivera is aware that he needs to schedule a diabetic eye exam for himself.  2. HTN:  BP well-controlled with current dosage of Lisinopril-HCTZ. No changes to HTN medication regimen  needed at this time.  3. Hyperlipidemia:  Continue taking Pravastatin 20 mg QD. Will check Lipid Panel and CMP today with bloodwork.  4. Anxiety:  Well-controlled on Paroxetine 20 mg QD. No changes to anxiety medication regimen needed at this time.  5. Health Maintenance:  Mr. Goedert will receive the Tdap vaccine today in clinic. He will get the Shingles vaccine at his next follow up appointment. He received his second Covid-19 vaccine dose in early April.  Follow up plan:  Follow up in 3 months for next T2DM, HTN, Hyperlipidemia,  and Anxiety recheck.  Orders Placed This Encounter  Procedures  . Tdap vaccine greater than or equal to 7yo IM  . Microalbumin / creatinine urine ratio  . Bayer [facility] Hb A1c Waived   Gaynelle Arabian, PA-S2 [facility] 06/27/2019, 8:21 PM   Patient seen and examined with Gaynelle Arabian, PA student, agree with assessment and plan above.  Patient is doing better with diabetes and blood pressure looks good with current dose.  He has been taking the pravastatin which is relatively new. Caryl Pina, MD [facility] 06/27/2019, 8:22 PM

## 2019-06-19 ENCOUNTER — Other Ambulatory Visit (HOSPITAL_COMMUNITY)
Admission: RE | Admit: 2019-06-19 | Discharge: 2019-06-19 | Disposition: A | Discharge status: Home / Self Care | Payer: BC Managed Care – PPO | Source: Ambulatory Visit | Attending: Urology | Admitting: Urology

## 2019-06-19 DIAGNOSIS — Z01812 Encounter for preprocedural laboratory examination: Secondary | ICD-10-CM | POA: Insufficient documentation

## 2019-06-19 DIAGNOSIS — Z20822 Contact with and (suspected) exposure to covid-19: Secondary | ICD-10-CM | POA: Diagnosis not present

## 2019-06-19 LAB — MICROALBUMIN / CREATININE URINE RATIO
Creatinine, Urine: 70.3 mg/dL
Microalb/Creat Ratio: 10 mg/g creat (ref 0–29)
Microalbumin, Urine: 7 ug/mL

## 2019-06-19 LAB — SARS CORONAVIRUS 2 (TAT 6-24 HRS): SARS Coronavirus 2: NEGATIVE

## 2019-06-23 ENCOUNTER — Encounter (HOSPITAL_BASED_OUTPATIENT_CLINIC_OR_DEPARTMENT_OTHER)
Admission: RE | Disposition: A | Discharge status: Home / Self Care | Payer: Self-pay | Source: Home / Self Care | Attending: Urology

## 2019-06-23 ENCOUNTER — Encounter (HOSPITAL_BASED_OUTPATIENT_CLINIC_OR_DEPARTMENT_OTHER): Payer: Self-pay | Admitting: Urology

## 2019-06-23 ENCOUNTER — Ambulatory Visit (HOSPITAL_BASED_OUTPATIENT_CLINIC_OR_DEPARTMENT_OTHER): Payer: BC Managed Care – PPO | Admitting: Anesthesiology

## 2019-06-23 ENCOUNTER — Ambulatory Visit (HOSPITAL_BASED_OUTPATIENT_CLINIC_OR_DEPARTMENT_OTHER)
Admission: RE | Admit: 2019-06-23 | Discharge: 2019-06-23 | Disposition: A | Discharge status: Home / Self Care | Payer: BC Managed Care – PPO | Attending: Urology | Admitting: Urology

## 2019-06-23 DIAGNOSIS — N471 Phimosis: Secondary | ICD-10-CM | POA: Insufficient documentation

## 2019-06-23 DIAGNOSIS — D290 Benign neoplasm of penis: Secondary | ICD-10-CM | POA: Diagnosis not present

## 2019-06-23 DIAGNOSIS — E119 Type 2 diabetes mellitus without complications: Secondary | ICD-10-CM | POA: Insufficient documentation

## 2019-06-23 DIAGNOSIS — E669 Obesity, unspecified: Secondary | ICD-10-CM | POA: Diagnosis not present

## 2019-06-23 DIAGNOSIS — Z7984 Long term (current) use of oral hypoglycemic drugs: Secondary | ICD-10-CM | POA: Diagnosis not present

## 2019-06-23 DIAGNOSIS — I10 Essential (primary) hypertension: Secondary | ICD-10-CM | POA: Insufficient documentation

## 2019-06-23 DIAGNOSIS — N480 Leukoplakia of penis: Secondary | ICD-10-CM | POA: Insufficient documentation

## 2019-06-23 DIAGNOSIS — M1990 Unspecified osteoarthritis, unspecified site: Secondary | ICD-10-CM | POA: Diagnosis not present

## 2019-06-23 DIAGNOSIS — A630 Anogenital (venereal) warts: Secondary | ICD-10-CM | POA: Diagnosis not present

## 2019-06-23 DIAGNOSIS — E785 Hyperlipidemia, unspecified: Secondary | ICD-10-CM | POA: Diagnosis not present

## 2019-06-23 DIAGNOSIS — N481 Balanitis: Secondary | ICD-10-CM | POA: Diagnosis not present

## 2019-06-23 DIAGNOSIS — Z6830 Body mass index (BMI) 30.0-30.9, adult: Secondary | ICD-10-CM | POA: Insufficient documentation

## 2019-06-23 DIAGNOSIS — E7800 Pure hypercholesterolemia, unspecified: Secondary | ICD-10-CM | POA: Insufficient documentation

## 2019-06-23 DIAGNOSIS — K219 Gastro-esophageal reflux disease without esophagitis: Secondary | ICD-10-CM | POA: Diagnosis not present

## 2019-06-23 DIAGNOSIS — Z79899 Other long term (current) drug therapy: Secondary | ICD-10-CM | POA: Insufficient documentation

## 2019-06-23 DIAGNOSIS — Q541 Hypospadias, penile: Secondary | ICD-10-CM | POA: Diagnosis not present

## 2019-06-23 DIAGNOSIS — N35911 Unspecified urethral stricture, male, meatal: Secondary | ICD-10-CM | POA: Insufficient documentation

## 2019-06-23 DIAGNOSIS — G4730 Sleep apnea, unspecified: Secondary | ICD-10-CM | POA: Insufficient documentation

## 2019-06-23 DIAGNOSIS — N475 Adhesions of prepuce and glans penis: Secondary | ICD-10-CM | POA: Diagnosis not present

## 2019-06-23 HISTORY — PX: CIRCUMCISION: SHX1350

## 2019-06-23 HISTORY — DX: Type 2 diabetes mellitus without complications: E11.9

## 2019-06-23 LAB — POCT I-STAT, CHEM 8
BUN: 23 mg/dL — ABNORMAL HIGH (ref 6–20)
Calcium, Ion: 1.26 mmol/L (ref 1.15–1.40)
Chloride: 101 mmol/L (ref 98–111)
Creatinine, Ser: 0.9 mg/dL (ref 0.61–1.24)
Glucose, Bld: 180 mg/dL — ABNORMAL HIGH (ref 70–99)
HCT: 46 % (ref 39.0–52.0)
Hemoglobin: 15.6 g/dL (ref 13.0–17.0)
Potassium: 4.1 mmol/L (ref 3.5–5.1)
Sodium: 138 mmol/L (ref 135–145)
TCO2: 28 mmol/L (ref 22–32)

## 2019-06-23 LAB — GLUCOSE, CAPILLARY: Glucose-Capillary: 142 mg/dL — ABNORMAL HIGH (ref 70–99)

## 2019-06-23 SURGERY — CIRCUMCISION, ADULT
Anesthesia: General | Site: Penis

## 2019-06-23 MED ORDER — MIDAZOLAM HCL 2 MG/2ML IJ SOLN
INTRAMUSCULAR | Status: AC
  Filled 2019-06-23: qty 2

## 2019-06-23 MED ORDER — FENTANYL CITRATE (PF) 100 MCG/2ML IJ SOLN
INTRAMUSCULAR | Status: DC | PRN
  Administered 2019-06-23 (×4): 50 ug via INTRAVENOUS

## 2019-06-23 MED ORDER — ONDANSETRON HCL 4 MG/2ML IJ SOLN: 4.0000 mg | Freq: Once | INTRAMUSCULAR | Status: DC | PRN

## 2019-06-23 MED ORDER — HYDROCODONE-ACETAMINOPHEN 7.5-325 MG PO TABS
ORAL_TABLET | ORAL | Status: AC
  Filled 2019-06-23: qty 1

## 2019-06-23 MED ORDER — CEFAZOLIN SODIUM-DEXTROSE 2-4 GM/100ML-% IV SOLN
INTRAVENOUS | Status: AC
  Filled 2019-06-23: qty 100

## 2019-06-23 MED ORDER — ONDANSETRON HCL 4 MG/2ML IJ SOLN
INTRAMUSCULAR | Status: DC | PRN
  Administered 2019-06-23: 4 mg via INTRAVENOUS

## 2019-06-23 MED ORDER — LIDOCAINE 2% (20 MG/ML) 5 ML SYRINGE
INTRAMUSCULAR | Status: DC | PRN
  Administered 2019-06-23: 75 mg via INTRAVENOUS

## 2019-06-23 MED ORDER — HYDROCODONE-ACETAMINOPHEN 5-325 MG PO TABS: 1.0000 | ORAL_TABLET | ORAL | 0 refills | Status: DC | PRN

## 2019-06-23 MED ORDER — FENTANYL CITRATE (PF) 100 MCG/2ML IJ SOLN
INTRAMUSCULAR | Status: AC
  Filled 2019-06-23: qty 2

## 2019-06-23 MED ORDER — FENTANYL CITRATE (PF) 100 MCG/2ML IJ SOLN: 25.0000 ug | INTRAMUSCULAR | Status: DC | PRN

## 2019-06-23 MED ORDER — MIDAZOLAM HCL 5 MG/5ML IJ SOLN
INTRAMUSCULAR | Status: DC | PRN
  Administered 2019-06-23: 2 mg via INTRAVENOUS

## 2019-06-23 MED ORDER — PROPOFOL 10 MG/ML IV BOLUS
INTRAVENOUS | Status: AC
  Filled 2019-06-23: qty 20

## 2019-06-23 MED ORDER — BUPIVACAINE HCL (PF) 0.25 % IJ SOLN
INTRAMUSCULAR | Status: DC | PRN
  Administered 2019-06-23: 20 mL

## 2019-06-23 MED ORDER — LACTATED RINGERS IV SOLN: INTRAVENOUS | Status: DC

## 2019-06-23 MED ORDER — CEFAZOLIN SODIUM-DEXTROSE 2-4 GM/100ML-% IV SOLN
2.0000 g | INTRAVENOUS | Status: AC
  Administered 2019-06-23: 2 g via INTRAVENOUS

## 2019-06-23 MED ORDER — HYDROCODONE-ACETAMINOPHEN 7.5-325 MG PO TABS
1.0000 | ORAL_TABLET | Freq: Once | ORAL | Status: AC | PRN
  Administered 2019-06-23: 1 via ORAL

## 2019-06-23 MED ORDER — LIDOCAINE 2% (20 MG/ML) 5 ML SYRINGE
INTRAMUSCULAR | Status: AC
  Filled 2019-06-23: qty 5

## 2019-06-23 MED ORDER — PROPOFOL 10 MG/ML IV BOLUS
INTRAVENOUS | Status: DC | PRN
  Administered 2019-06-23: 200 mg via INTRAVENOUS

## 2019-06-23 SURGICAL SUPPLY — 32 items
BALLN NEPHROSTOMY (BALLOONS) ×2
BALLOON NEPHROSTOMY (BALLOONS) ×1 IMPLANT
BLADE CLIPPER SENSICLIP SURGIC (BLADE) ×2 IMPLANT
BLADE SURG 15 STRL LF DISP TIS (BLADE) ×1 IMPLANT
BLADE SURG 15 STRL SS (BLADE) ×1
BNDG COHESIVE 1X5 TAN NS LF (GAUZE/BANDAGES/DRESSINGS) ×2 IMPLANT
BNDG COHESIVE 1X5 TAN STRL LF (GAUZE/BANDAGES/DRESSINGS) ×2 IMPLANT
BNDG CONFORM 2 STRL LF (GAUZE/BANDAGES/DRESSINGS) ×2 IMPLANT
CATH FOLEY 2W COUNCIL 5CC 18FR (CATHETERS) ×2 IMPLANT
COVER BACK TABLE 60X90IN (DRAPES) ×2 IMPLANT
COVER MAYO STAND STRL (DRAPES) ×2 IMPLANT
COVER WAND RF STERILE (DRAPES) ×2 IMPLANT
DRAPE LAPAROTOMY T 98X78 PEDS (DRAPES) ×2 IMPLANT
ELECT REM PT RETURN 9FT ADLT (ELECTROSURGICAL) ×2
ELECTRODE REM PT RTRN 9FT ADLT (ELECTROSURGICAL) ×1 IMPLANT
GAUZE XEROFORM 1X8 LF (GAUZE/BANDAGES/DRESSINGS) ×2 IMPLANT
GLOVE BIO SURGEON STRL SZ7.5 (GLOVE) ×2 IMPLANT
GOWN STRL REUS W/TWL LRG LVL3 (GOWN DISPOSABLE) ×2 IMPLANT
GUIDEWIRE STR DUAL SENSOR (WIRE) ×2 IMPLANT
NEEDLE HYPO 25X1 1.5 SAFETY (NEEDLE) ×2 IMPLANT
NS IRRIG 1000ML POUR BTL (IV SOLUTION) IMPLANT
PENCIL BUTTON HOLSTER BLD 10FT (ELECTRODE) ×2 IMPLANT
PLUG CATH AND CAP STER (CATHETERS) ×2 IMPLANT
SET BASIN DAY SURGERY F.S. (CUSTOM PROCEDURE TRAY) ×2 IMPLANT
SUT CHROMIC 3 0 SH 27 (SUTURE) ×4 IMPLANT
SYR 50ML LL SCALE MARK (SYRINGE) ×2 IMPLANT
SYR CONTROL 10ML LL (SYRINGE) ×2 IMPLANT
TOWEL OR 17X26 10 PK STRL BLUE (TOWEL DISPOSABLE) ×2 IMPLANT
TRAY DSU PREP LF (CUSTOM PROCEDURE TRAY) ×2 IMPLANT
TUBE CONNECTING 12X1/4 (SUCTIONS) ×2 IMPLANT
WATER STERILE IRR 1000ML POUR (IV SOLUTION) IMPLANT
YANKAUER SUCT BULB TIP NO VENT (SUCTIONS) ×2 IMPLANT

## 2019-06-23 NOTE — Op Note (Signed)
Operative Note  Preoperative diagnosis:  1.  Chronic balanitis, phimosis, very mild hypospadias, distal pendulous and meatal urethral stricture 2.  Penile condyloma  Postoperative diagnosis: 1.  Same  Procedure(s): 1.  Circumcision with lysis of adhesions 2.  Urethral balloon dilation and cystoscopy 3.  Complex catheter placement over a wire 4.  Excision of penile condyloma x2   Surgeon: Link Snuffer, MD  Assistants: None  Anesthesia: None  Complications: None immediate  EBL: 20 cc  Specimens: 1.  Foreskin 2.  Penile condyloma x2  Drains/Catheters: 1.  None  Intraoperative findings: 1.  The patient had very dense adherent adhesions.  The head of the penis and distal foreskin were quite diseased from chronic balanitis.   2.  Approximately 3 cm distal urethral stricture up to the level of a very mild hypospadias.  Otherwise, urethra was normal.  Normal bladder mucosa. 3.  2 separate penile condylomas at the base of the penis approximately 2 x 2 centimeter  Indication: 54YO male with phimosis and chronic balanitis with significant penile adhesions as well as meatal stenosis desires the above operation  Description of procedure:  The patient was identified and consent was obtained.  The patient was taken to the operating room and placed in the supine position.  The patient was placed under general anesthesia.  Perioperative antibiotics were administered.  The patient was placed in supine position.  Patient was prepped and draped in a standard sterile fashion and a timeout was performed.  Perioperative antibiotics were administered.  Foreskin adhesions were released with a tenotomy scissor.  The distal foreskin as well as the head of the penis were significantly inflamed and diseased from his chronic balanitis.  I decided to perform a urethral dilation first.  I passed a wire into the urethra and into the bladder.  I then passed a urethral balloon dilator over the wire and  inflated to a pressure of 16.  I deflated the balloon and removed it.  I kept the wire in place and performed a flexible cystoscopy with the findings noted above.  I withdrew the cystoscope and placed an 18 [place] council tip catheter over the wire followed by removal of the wire.  I capped the Foley catheter and this helped with retraction.   The foreskin was then retracted and Betadine was applied to the inner portion of the foreskin and the head of the penis.  A marking pen was used to mark out a circumferential line approximate 1 cm proximal to the corona.  A scalpel was used to sharply incise along this line.  Took extra care not to cut too deep on the ventral aspect overlying the urethra given his very slight hypospadias and thin tissue there.  The foreskin was then returned to its normal position and another circumferential line was made along the level of the corona with a marking pen.  A scalpel was again used to sharply incise along this line.  A straight clamp was used to clamp the midline of the foreskin.  This was then divided.  The excess foreskin was then removed with Bovie electrocautery taking great care not to come close to the glans.  The foreskin was collected for specimen.  Hemostasis was obtained with Bovie electrocautery.  The skin was then reapproximated with interrupted 3-0 chromic sutures and a dressing was applied.    I sharply excised the 2 penile condylomas.  I obtained hemostasis with spot electrocautery followed by closure of each of the incisions with a  running 3-0 chromic.  I passed these off for specimen.  This concluded the operation.  The patient tolerated the procedure well was stable postoperatively.  Plan: Patient will return in several weeks for a postoperative check.

## 2019-06-23 NOTE — Discharge Instructions (Addendum)
Circumcision-Home Care Instructions  The following instructions have been prepared to help you care for yourself upon your return home today.   Wound Care & Hygiene:   You may apply ice to the penis.  This may help to decrease swelling.  Remove the dressing tomorrow.  If the dressing falls off before then, leave it off.  You may shower or bathe in 48 hours  Gently wash the penis with soap and water.  The stitches do not need to be removed.  Activity:  Do not drive or operate any equipment today.  The effects of anesthesia are still present, drowsiness may result.  Rest today, not necessarily flat bed rest, just take it easy.  You may resume your normal activity in one to two days or as indicated by your physician.  Sexual Activity:  Erection and sexual relations should be avoided for 2 weeks.  Return to Work:  One to two days or as indicated by your physician.  Diet:  Drink liquids or eat a very light diet this evening.  You may resume a regular diet tomorrow.  General Expectations of your surgery:   You may have a small amount of bleeding  The penis will be swollen and bruised for approximately one week  You may wake during the night with an erection, usually this is caused by having a full bladder so you should try to urinate (pass your water) to relieve the erection or apply ice to the penis  Unexpected Observations - Call your doctor if these occur!  Persistent or heavy bleeding  Temperature of 101 degrees or more  Severe pain not relieved by medication  Return to Office .  Call to schedule your appointment .    Post Anesthesia Home Care Instructions  Activity: Get plenty of rest for the remainder of the day. A responsible adult should stay with you for 24 hours following the procedure.  For the next 24 hours, DO NOT: -Drive a car -Paediatric nurse -Drink alcoholic beverages -Take any medication unless instructed by your physician -Make any legal decisions or  sign important papers.  Meals: Start with liquid foods such as gelatin or soup. Progress to regular foods as tolerated. Avoid greasy, spicy, heavy foods. If nausea and/or vomiting occur, drink only clear liquids until the nausea and/or vomiting subsides. Call your physician if vomiting continues.  Special Instructions/Symptoms: Your throat may feel dry or sore from the anesthesia or the breathing tube placed in your throat during surgery. If this causes discomfort, gargle with warm salt water. The discomfort should disappear within 24 hours.    Remove your dressing in 2 days.  If it falls off, just leave it off.  If it feels like it is too tight around the penis, just go ahead and take it off.  Otherwise take it off in 2 days and begin showering.  No swimming or tub baths until completely healed.  Apply Neosporin or bacitracin to the area 1-2 times daily after removal of the dressing and be sure to shower daily.  Do not scrub at the area.  Just gently cleanse with soap and water.  If there are any questions or problems, please call the office at [phone]

## 2019-06-23 NOTE — Transfer of Care (Signed)
Immediate Anesthesia Transfer of Care Note  Patient: Alejandro Rivera  Procedure(s) Performed: CIRCUMCISION ADULT MEATOTOMY, URETHRAL DIALATION, CYSTOSCOPY, AND EXCSION OF A CONDYLOMA (N/A Penis)  Patient Location: PACU  Anesthesia Type:General  Level of Consciousness: awake, alert  and oriented  Airway & Oxygen Therapy: Patient Spontanous Breathing and Patient connected to nasal cannula oxygen  Post-op Assessment: Report given to RN and Post -op Vital signs reviewed and stable  Post vital signs: Reviewed and stable  Last Vitals:  Vitals Value Taken Time  BP 146/97 06/23/19 1157  Temp 37.1 C 06/23/19 1157  Pulse 92 06/23/19 1158  Resp 11 06/23/19 1158  SpO2 97 % 06/23/19 1158  Vitals shown include unvalidated device data.  Last Pain:  Vitals:   06/23/19 0853  TempSrc: Oral  PainSc: 0-No pain      Patients Stated Pain Goal: 6 ([id number])  Complications: No apparent anesthesia complications

## 2019-06-23 NOTE — Anesthesia Preprocedure Evaluation (Signed)
Anesthesia Evaluation  Patient identified by MRN, date of birth, ID band Patient awake    Reviewed: Allergy & Precautions, NPO status , Patient's Chart, lab work & pertinent test results  Airway Mallampati: II  TM Distance: >3 FB Neck ROM: Full    Dental no notable dental hx. (+) Teeth Intact   Pulmonary neg pulmonary ROS,    Pulmonary exam normal breath sounds clear to auscultation       Cardiovascular hypertension, Pt. on medications Normal cardiovascular exam Rhythm:Regular Rate:Normal     Neuro/Psych PSYCHIATRIC DISORDERS Anxiety Depression negative neurological ROS     GI/Hepatic Neg liver ROS, GERD  Controlled and Medicated,  Endo/Other  diabetes, Well Controlled, Type 2, Oral Hypoglycemic AgentsHyperlipidemia Obesity  Renal/GU negative Renal ROS  negative genitourinary   Musculoskeletal   Abdominal (+) + obese,   Peds  Hematology negative hematology ROS (+)   Anesthesia Other Findings   Reproductive/Obstetrics Balanitis Meatal stenosis                             Anesthesia Physical Anesthesia Plan  ASA: II  Anesthesia Plan: General   Post-op Pain Management:    Induction: Intravenous  PONV Risk Score and Plan: 4 or greater and Midazolam, Ondansetron and Treatment may vary due to age or medical condition  Airway Management Planned: LMA  Additional Equipment:   Intra-op Plan:   Post-operative Plan: Extubation in OR  Informed Consent: I have reviewed the patients History and Physical, chart, labs and discussed the procedure including the risks, benefits and alternatives for the proposed anesthesia with the patient or authorized representative who has indicated his/her understanding and acceptance.       Plan Discussed with: CRNA and Surgeon  Anesthesia Plan Comments:         Anesthesia Quick Evaluation

## 2019-06-23 NOTE — H&P (Signed)
CC/HPI: CC: Balanitis, meatal stenosis, weak stream  HPI:  54 year old male who had a circumcision when he was young. He is diabetic. He has been having problems with excess foreskin and balanitis. He also has weak stream. Diabetes is under moderate control. Last A1c was 8.1. Last PSA was in 2018 and was 0.6. He is due for another PSA. Erections hurt due to the excess foreskin and adhesions. He does have some mild erectile dysfunction that he attributes to the pain from erections.     ALLERGIES: No Known Allergies    MEDICATIONS: Allopurinol  Lisinopril  Metformin Hcl  Omeprazole  Paroxetine Er  Pravastatin Sodium     GU PSH: None   NON-GU PSH: Remove Gallbladder     GU PMH: None   NON-GU PMH: Anxiety Arthritis Diabetes Type 2 GERD Gout Hypercholesterolemia Hypertension Sleep Apnea    FAMILY HISTORY: 2 daughters - Other Cancer - Aunt Diabetes - Mother Heart Disease - Uncle Hypertension - Uncle   SOCIAL HISTORY: Marital Status: Married Preferred Language: English Current Smoking Status: Patient has never smoked.   Tobacco Use Assessment Completed: Used Tobacco in last 30 days? Drinks 3 drinks per week.  Does not use drugs. Drinks 2 caffeinated drinks per day. Patient's occupation Agricultural consultant.    REVIEW OF SYSTEMS:    GU Review Male:   Patient reports burning/ pain with urination, stream starts and stops, erection problems, and penile pain. Patient denies frequent urination, hard to postpone urination, get up at night to urinate, leakage of urine, trouble starting your stream, and have to strain to urinate .  Gastrointestinal (Upper):   Patient denies nausea, vomiting, and indigestion/ heartburn.  Gastrointestinal (Lower):   Patient denies diarrhea and constipation.  Constitutional:   Patient denies fever, night sweats, weight loss, and fatigue.  Skin:   Patient denies skin rash/ lesion and itching.  Eyes:   Patient denies blurred vision and double vision.   Ears/ Nose/ Throat:   Patient denies sore throat and sinus problems.  Hematologic/Lymphatic:   Patient denies swollen glands and easy bruising.  Cardiovascular:   Patient denies leg swelling and chest pains.  Respiratory:   Patient denies cough and shortness of breath.  Endocrine:   Patient denies excessive thirst.  Musculoskeletal:   Patient reports joint pain. Patient denies back pain.  Neurological:   Patient denies headaches and dizziness.  Psychologic:   Patient denies depression and anxiety.   Notes: Painful intercourse, weak stream    VITAL SIGNS:      06/15/2019 08:57 AM  Weight 215 lb / 97.52 kg  Height 70 in / 177.8 cm  BP 141/99 mmHg  Heart Rate 71 /min  Temperature 98.4 F / 36.8 C  BMI 30.8 kg/m   GU PHYSICAL EXAMINATION:    Penis: Uncircumcised. Able to retract foreskin but there are significant penile adhesions. Evidence of chronic balanitis with meatal stenosis.  Prostate: Prostate about 40 grams. Left lobe normal consistency, right lobe normal consistency. Symmetrical lobes. No prostate nodule. Left lobe no tenderness, right lobe no tenderness.    MULTI-SYSTEM PHYSICAL EXAMINATION:    Constitutional: Well-nourished. No physical deformities. Normally developed. Good grooming.  Neck: Neck symmetrical, not swollen. Normal tracheal position.  Respiratory: No labored breathing, no use of accessory muscles.   Cardiovascular: Normal temperature, normal extremity pulses, no swelling, no varicosities.  Lymphatic: No enlargement of neck, axillae, groin.  Skin: No paleness, no jaundice, no cyanosis. No lesion, no ulcer, no rash.  Neurologic / Psychiatric: Oriented  to time, oriented to place, oriented to person. No depression, no anxiety, no agitation.  Gastrointestinal: No mass, no tenderness, no rigidity, non obese abdomen.  Eyes: Normal conjunctivae. Normal eyelids.  Ears, Nose, Mouth, and Throat: Left ear no scars, no lesions, no masses. Right ear no scars, no lesions,  no masses. Nose no scars, no lesions, no masses. Normal hearing. Normal lips.  Musculoskeletal: Normal gait and station of head and neck.     Complexity of Data:  Lab Test Review:   PSA, Hgb-A1c  Records Review:   Previous Doctor Records, Previous Patient Records  Urine Test Review:   Urinalysis   PROCEDURES:         PVR Ultrasound - [id number]  Scanned Volume: 105 cc         Urinalysis w/Scope Micro  WBC/hpf: 0 - 5/hpf  RBC/hpf: 0 - 2/hpf  Bacteria: NS (Not Seen)  Cystals: NS (Not Seen)  Casts: NS (Not Seen)  Trichomonas: Not Present  Mucous: Not Present  Epithelial Cells: NS (Not Seen)  Yeast: NS (Not Seen)  Sperm: Not Present    ASSESSMENT:      ICD-10 Details  1 GU:   Encounter for Prostate Cancer screening - Z12.5 Undiagnosed New Problem  3   Penile adhesions - N47.5 Undiagnosed New Problem  4   Balanitis - N48.1 Undiagnosed New Problem  2 NON-GU:   Other urethral stricture, male, meatal - N35.811 Undiagnosed New Problem   PLAN:           Orders Labs PSA          Document Letter(s):  Created for Patient: Clinical Summary         Notes:   PSA for prostate cancer screening   Plan for circumcision with lysis of penile adhesions as well as meatotomy. Risks and benefits discussed.   Cc: Dr. Lajuana Ripple    Signed by Link Snuffer, III, M.D. on 06/15/19 at 9:38 AM (EDT

## 2019-06-23 NOTE — Anesthesia Postprocedure Evaluation (Signed)
Anesthesia Post Note  Patient: Alejandro Rivera  Procedure(s) Performed: CIRCUMCISION ADULT MEATOTOMY, URETHRAL DIALATION, CYSTOSCOPY, AND EXCSION OF A CONDYLOMA (N/A Penis)     Patient location during evaluation: PACU Anesthesia Type: General Level of consciousness: awake and alert and oriented Pain management: pain level controlled Vital Signs Assessment: post-procedure vital signs reviewed and stable Respiratory status: spontaneous breathing, nonlabored ventilation and respiratory function stable Cardiovascular status: blood pressure returned to baseline and stable Postop Assessment: no apparent nausea or vomiting Anesthetic complications: no    Last Vitals:  Vitals:   06/23/19 1229 06/23/19 1230  BP:  123/86  Pulse: 79 78  Resp: 12 11  Temp:    SpO2: 97% 97%    Last Pain:  Vitals:   06/23/19 1230  TempSrc:   PainSc: 0-No pain                 Breslyn Abdo A.

## 2019-06-23 NOTE — Interval H&P Note (Signed)
History and Physical Interval Note:  06/23/2019 9:24 AM  Alejandro Rivera  has presented today for surgery, with the diagnosis of BALANITIS, MEATAL STENOSIS.  The various methods of treatment have been discussed with the patient and family. After consideration of risks, benefits and other options for treatment, the patient has consented to  Procedure(s): CIRCUMCISION ADULT MEATOTOMY (N/A) as a surgical intervention.  The patient's history has been reviewed, patient examined, no change in status, stable for surgery.  I have reviewed the patient's chart and labs.  Questions were answered to the patient's satisfaction.     Marton Redwood, III

## 2019-06-23 NOTE — Anesthesia Procedure Notes (Signed)
Procedure Name: LMA Insertion Date/Time: 06/23/2019 10:39 AM Performed by: Lollie Sails, CRNA Pre-anesthesia Checklist: Patient identified, Emergency Drugs available, Suction available, Patient being monitored and Timeout performed Patient Re-evaluated:Patient Re-evaluated prior to induction Oxygen Delivery Method: Circle system utilized Preoxygenation: Pre-oxygenation with 100% oxygen Induction Type: IV induction Ventilation: Mask ventilation without difficulty LMA: LMA inserted LMA Size: 5.0 Number of attempts: 1 Placement Confirmation: positive ETCO2 and breath sounds checked- equal and bilateral Tube secured with: Tape Dental Injury: Teeth and Oropharynx as per pre-operative assessment

## 2019-06-24 LAB — SURGICAL PATHOLOGY

## 2019-07-02 ENCOUNTER — Other Ambulatory Visit: Payer: Self-pay | Admitting: Family Medicine

## 2019-07-23 DIAGNOSIS — N481 Balanitis: Secondary | ICD-10-CM | POA: Diagnosis not present

## 2019-07-23 DIAGNOSIS — N475 Adhesions of prepuce and glans penis: Secondary | ICD-10-CM | POA: Diagnosis not present

## 2019-08-28 ENCOUNTER — Other Ambulatory Visit: Payer: Self-pay | Admitting: Family Medicine

## 2019-08-28 DIAGNOSIS — E1169 Type 2 diabetes mellitus with other specified complication: Secondary | ICD-10-CM

## 2019-08-28 DIAGNOSIS — I10 Essential (primary) hypertension: Secondary | ICD-10-CM

## 2019-09-17 ENCOUNTER — Other Ambulatory Visit: Payer: Self-pay

## 2019-09-17 ENCOUNTER — Ambulatory Visit: Payer: BC Managed Care – PPO | Admitting: Family Medicine

## 2019-09-17 ENCOUNTER — Encounter: Payer: Self-pay | Admitting: Family Medicine

## 2019-09-17 VITALS — BP 122/81 | HR 75 | Temp 97.0°F | Ht 70.0 in | Wt 222.0 lb

## 2019-09-17 DIAGNOSIS — K219 Gastro-esophageal reflux disease without esophagitis: Secondary | ICD-10-CM

## 2019-09-17 DIAGNOSIS — I10 Essential (primary) hypertension: Secondary | ICD-10-CM

## 2019-09-17 DIAGNOSIS — E785 Hyperlipidemia, unspecified: Secondary | ICD-10-CM

## 2019-09-17 DIAGNOSIS — F411 Generalized anxiety disorder: Secondary | ICD-10-CM | POA: Diagnosis not present

## 2019-09-17 DIAGNOSIS — N529 Male erectile dysfunction, unspecified: Secondary | ICD-10-CM

## 2019-09-17 DIAGNOSIS — Z1159 Encounter for screening for other viral diseases: Secondary | ICD-10-CM

## 2019-09-17 DIAGNOSIS — Z23 Encounter for immunization: Secondary | ICD-10-CM

## 2019-09-17 DIAGNOSIS — E1169 Type 2 diabetes mellitus with other specified complication: Secondary | ICD-10-CM

## 2019-09-17 LAB — BAYER DCA HB A1C WAIVED: HB A1C (BAYER DCA - WAIVED): 7.8 % — ABNORMAL HIGH (ref <7.0)

## 2019-09-17 MED ORDER — METFORMIN HCL 500 MG PO TABS: 1000.0000 mg | ORAL_TABLET | Freq: Two times a day (BID) | ORAL | 3 refills | Status: DC

## 2019-09-17 MED ORDER — SILDENAFIL CITRATE 20 MG PO TABS: 20.0000 mg | ORAL_TABLET | ORAL | 1 refills | Status: AC | PRN

## 2019-09-17 NOTE — Progress Notes (Signed)
BP 122/81   Pulse 75   Temp (!) 97 F (36.1 C)   Ht 5\' 10"  (1.778 m)   Wt 222 lb (100.7 kg)   SpO2 98%   BMI 31.85 kg/m    Subjective:   Patient ID: Alejandro Rivera, male    DOB: Mar 01, 1965, 54 y.o.   MRN: [id number]  HPI: Alejandro Rivera is a 54 y.o. male presenting on 09/17/2019 for Medical Management of Chronic Issues, Diabetes, Hyperlipidemia, and Gastroesophageal Reflux   HPI Type 2 diabetes mellitus Patient comes in today for recheck of his diabetes. Patient has been currently taking Metformin 500 twice a day, A1c is up at 7.8 today.. Patient is currently on an ACE inhibitor/ARB. Patient has not seen an ophthalmologist this year. Patient denies any issues with their feet. The symptom started onset as an adult hypertension and hyperlipidemia ARE RELATED TO DM   Hypertension Patient is currently on lisinopril hydrochlorothiazide, and their blood pressure today is 122/81. Patient denies any lightheadedness or dizziness. Patient denies headaches, blurred vision, chest pains, shortness of breath, or weakness. Denies any side effects from medication and is content with current medication.   Hyperlipidemia Patient is coming in for recheck of his hyperlipidemia. The patient is currently taking pravastatin. They deny any issues with myalgias or history of liver damage from it. They deny any focal numbness or weakness or chest pain.   GERD Patient is currently on omeprazole.  She denies any major symptoms or abdominal pain or belching or burping. She denies any blood in her stool or lightheadedness or dizziness.  Anxiety recheck Patient is coming in for anxiety recheck and currently taking Paxil, used to get hives from his anxiety and he has not had that since has been on the Paxil is very happy with it.  Patient says his anxiety medicine is caused him to have erectile dysfunction some of the time and he would like to try something to help with that.  Relevant past medical,  surgical, family and social history reviewed and updated as indicated. Interim medical history since our last visit reviewed. Allergies and medications reviewed and updated.  Review of Systems  Constitutional: Negative for chills and fever.  Respiratory: Negative for shortness of breath and wheezing.   Cardiovascular: Negative for chest pain and leg swelling.  Musculoskeletal: Negative for back pain and gait problem.  Skin: Negative for rash.  Neurological: Negative for dizziness, weakness and numbness.  All other systems reviewed and are negative.   Per HPI unless specifically indicated above   Allergies as of 09/17/2019   No Known Allergies     Medication List       Accurate as of September 17, 2019 10:12 AM. If you have any questions, ask your nurse or doctor.        allopurinol 300 MG tablet Commonly known as: ZYLOPRIM Take 1 tablet by mouth once daily   HYDROcodone-acetaminophen 5-325 MG tablet Commonly known as: Norco Take 1 tablet by mouth every 4 (four) hours as needed for moderate pain.   IBUPROFEN PO Take by mouth daily as needed.   lisinopril-hydrochlorothiazide 20-25 MG tablet Commonly known as: ZESTORETIC Take 1 tablet by mouth once daily   metFORMIN 500 MG tablet Commonly known as: GLUCOPHAGE Take 1 tablet (500 mg total) by mouth 2 (two) times daily with a meal.   multivitamin tablet Take 1 tablet by mouth daily.   omeprazole 40 MG capsule Commonly known as: PRILOSEC Take 1 capsule (40 mg total)  by mouth daily.   PARoxetine 20 MG tablet Commonly known as: PAXIL Take 1 tablet (20 mg total) by mouth daily.   pravastatin 20 MG tablet Commonly known as: PRAVACHOL Take 1 tablet by mouth once daily        Objective:   BP 122/81   Pulse 75   Temp (!) 97 F (36.1 C)   Ht 5\' 10"  (1.778 m)   Wt 222 lb (100.7 kg)   SpO2 98%   BMI 31.85 kg/m   Wt Readings from Last 3 Encounters:  09/17/19 222 lb (100.7 kg)  06/23/19 215 lb 6.4 oz (97.7 kg)    06/18/19 219 lb 6 oz (99.5 kg)    Physical Exam Vitals and nursing note reviewed.  Constitutional:      General: He is not in acute distress.    Appearance: He is well-developed. He is not diaphoretic.  Eyes:     General: No scleral icterus.    Conjunctiva/sclera: Conjunctivae normal.  Neck:     Thyroid: No thyromegaly.  Cardiovascular:     Rate and Rhythm: Normal rate and regular rhythm.     Heart sounds: Normal heart sounds. No murmur heard.   Pulmonary:     Effort: Pulmonary effort is normal. No respiratory distress.     Breath sounds: Normal breath sounds. No wheezing.  Musculoskeletal:        General: Normal range of motion.     Cervical back: Neck supple.  Lymphadenopathy:     Cervical: No cervical adenopathy.  Skin:    General: Skin is warm and dry.     Findings: No rash.  Neurological:     Mental Status: He is alert and oriented to person, place, and time.     Coordination: Coordination normal.  Psychiatric:        Behavior: Behavior normal.     Assessment & Plan:   Problem List Items Addressed This Visit      Cardiovascular and Mediastinum   Essential hypertension   Relevant Medications   sildenafil (REVATIO) 20 MG tablet     Digestive   GERD (gastroesophageal reflux disease)   Relevant Orders   CBC with Differential/Platelet     Endocrine   Hyperlipidemia associated with type 2 diabetes mellitus ([place]) - Primary   Relevant Medications   metFORMIN (GLUCOPHAGE) 500 MG tablet   Other Relevant Orders   Lipid panel     Other   GAD (generalized anxiety disorder)   Relevant Orders   CBC with Differential/Platelet    Other Visit Diagnoses    Encounter for hepatitis C screening test for low risk patient       Relevant Orders   Hepatitis C antibody   Need for shingles vaccine       Relevant Orders   Varicella-zoster vaccine IM (Shingrix)   Erectile dysfunction, unspecified erectile dysfunction type          Increased Metformin to 1000 mg twice a  day, he is going to focus on diet and exercise.  Continue other medicine, no changes.  Did add Viagra as he can use as needed. Follow up plan: Return in about 3 months (around 12/18/2019), or if symptoms worsen or fail to improve, for Type 2 diabetes and hypertension.  Counseling provided for all of the vaccine components Orders Placed This Encounter  Procedures  . Varicella-zoster vaccine IM (Shingrix)  . Bayer DCA Hb A1c Waived  . Hepatitis C antibody    Caryl Pina, MD [facility] 09/17/2019, 10:12 AM

## 2019-09-18 LAB — LIPID PANEL
Chol/HDL Ratio: 3.7 ratio (ref 0.0–5.0)
Cholesterol, Total: 172 mg/dL (ref 100–199)
HDL: 47 mg/dL (ref >39)
LDL Chol Calc (NIH): 83 mg/dL (ref 0–99)
Triglycerides: 256 mg/dL — ABNORMAL HIGH (ref 0–149)
VLDL Cholesterol Cal: 42 mg/dL — ABNORMAL HIGH (ref 5–40)

## 2019-09-18 LAB — CBC WITH DIFFERENTIAL/PLATELET
Basophils Absolute: 0.1 10*3/uL (ref 0.0–0.2)
Basos: 1 %
EOS (ABSOLUTE): 0.2 10*3/uL (ref 0.0–0.4)
Eos: 3 %
Hematocrit: 41.7 % (ref 37.5–51.0)
Hemoglobin: 14.6 g/dL (ref 13.0–17.7)
Immature Grans (Abs): 0 10*3/uL (ref 0.0–0.1)
Immature Granulocytes: 0 %
Lymphocytes Absolute: 1.8 10*3/uL (ref 0.7–3.1)
Lymphs: 24 %
MCH: 33.3 pg — ABNORMAL HIGH (ref 26.6–33.0)
MCHC: 35 g/dL (ref 31.5–35.7)
MCV: 95 fL (ref 79–97)
Monocytes Absolute: 0.6 10*3/uL (ref 0.1–0.9)
Monocytes: 9 %
Neutrophils Absolute: 4.6 10*3/uL (ref 1.4–7.0)
Neutrophils: 63 %
Platelets: 223 10*3/uL (ref 150–450)
RBC: 4.39 x10E6/uL (ref 4.14–5.80)
RDW: 12.3 % (ref 11.6–15.4)
WBC: 7.3 10*3/uL (ref 3.4–10.8)

## 2019-09-18 LAB — CMP14+EGFR
ALT: 33 IU/L (ref 0–44)
AST: 28 IU/L (ref 0–40)
Albumin/Globulin Ratio: 1.8 (ref 1.2–2.2)
Albumin: 4.7 g/dL (ref 3.8–4.9)
Alkaline Phosphatase: 80 IU/L (ref 48–121)
BUN/Creatinine Ratio: 14 (ref 9–20)
BUN: 16 mg/dL (ref 6–24)
Bilirubin Total: 0.6 mg/dL (ref 0.0–1.2)
CO2: 24 mmol/L (ref 20–29)
Calcium: 10.3 mg/dL — ABNORMAL HIGH (ref 8.7–10.2)
Chloride: 98 mmol/L (ref 96–106)
Creatinine, Ser: 1.14 mg/dL (ref 0.76–1.27)
GFR calc Af Amer: 83 mL/min/{1.73_m2} (ref >59)
GFR calc non Af Amer: 72 mL/min/{1.73_m2} (ref >59)
Globulin, Total: 2.6 g/dL (ref 1.5–4.5)
Glucose: 182 mg/dL — ABNORMAL HIGH (ref 65–99)
Potassium: 4.7 mmol/L (ref 3.5–5.2)
Sodium: 137 mmol/L (ref 134–144)
Total Protein: 7.3 g/dL (ref 6.0–8.5)

## 2019-09-18 LAB — HEPATITIS C ANTIBODY: Hep C Virus Ab: <0.1 s/co ratio (ref 0.0–0.9)

## 2019-10-04 ENCOUNTER — Other Ambulatory Visit: Payer: Self-pay | Admitting: Family Medicine

## 2019-11-27 ENCOUNTER — Other Ambulatory Visit: Payer: Self-pay | Admitting: Family Medicine

## 2019-11-27 DIAGNOSIS — E785 Hyperlipidemia, unspecified: Secondary | ICD-10-CM

## 2019-11-27 DIAGNOSIS — E1169 Type 2 diabetes mellitus with other specified complication: Secondary | ICD-10-CM

## 2019-11-27 DIAGNOSIS — I10 Essential (primary) hypertension: Secondary | ICD-10-CM

## 2019-12-21 ENCOUNTER — Other Ambulatory Visit: Payer: Self-pay

## 2019-12-21 ENCOUNTER — Ambulatory Visit: Payer: BC Managed Care – PPO | Admitting: Family Medicine

## 2019-12-21 ENCOUNTER — Encounter: Payer: Self-pay | Admitting: Family Medicine

## 2019-12-21 VITALS — BP 128/88 | HR 83 | Temp 97.7°F | Ht 70.0 in | Wt 220.0 lb

## 2019-12-21 DIAGNOSIS — I10 Essential (primary) hypertension: Secondary | ICD-10-CM

## 2019-12-21 DIAGNOSIS — K219 Gastro-esophageal reflux disease without esophagitis: Secondary | ICD-10-CM | POA: Diagnosis not present

## 2019-12-21 DIAGNOSIS — F411 Generalized anxiety disorder: Secondary | ICD-10-CM | POA: Diagnosis not present

## 2019-12-21 DIAGNOSIS — E785 Hyperlipidemia, unspecified: Secondary | ICD-10-CM

## 2019-12-21 DIAGNOSIS — E1169 Type 2 diabetes mellitus with other specified complication: Secondary | ICD-10-CM | POA: Diagnosis not present

## 2019-12-21 LAB — BAYER DCA HB A1C WAIVED: HB A1C (BAYER DCA - WAIVED): 8.3 % — ABNORMAL HIGH (ref <7.0)

## 2019-12-21 MED ORDER — OMEPRAZOLE 40 MG PO CPDR: 40.0000 mg | DELAYED_RELEASE_CAPSULE | Freq: Every day | ORAL | 3 refills | Status: DC

## 2019-12-21 MED ORDER — PAROXETINE HCL 20 MG PO TABS: 20.0000 mg | ORAL_TABLET | Freq: Every day | ORAL | 3 refills | Status: DC

## 2019-12-21 MED ORDER — METFORMIN HCL 500 MG PO TABS: 1000.0000 mg | ORAL_TABLET | Freq: Two times a day (BID) | ORAL | 3 refills | Status: DC

## 2019-12-21 MED ORDER — DAPAGLIFLOZIN PROPANEDIOL 10 MG PO TABS: 10.0000 mg | ORAL_TABLET | Freq: Every day | ORAL | 3 refills | Status: DC

## 2019-12-21 MED ORDER — PRAVASTATIN SODIUM 20 MG PO TABS: 20.0000 mg | ORAL_TABLET | Freq: Every day | ORAL | 3 refills | Status: DC

## 2019-12-21 MED ORDER — ALLOPURINOL 300 MG PO TABS: 300.0000 mg | ORAL_TABLET | Freq: Every day | ORAL | 3 refills | Status: DC

## 2019-12-21 MED ORDER — LISINOPRIL-HYDROCHLOROTHIAZIDE 20-25 MG PO TABS: 1.0000 | ORAL_TABLET | Freq: Every day | ORAL | 3 refills | Status: DC

## 2019-12-21 NOTE — Progress Notes (Signed)
BP 128/88   Pulse 83   Temp 97.7 F (36.5 C)   Ht 5\' 10"  (1.778 m)   Wt 220 lb (99.8 kg)   SpO2 97%   BMI 31.57 kg/m    Subjective:   Patient ID: Alejandro Rivera, male    DOB: Apr 15, 1965, 54 y.o.   MRN: [id number]  HPI: Alejandro Rivera is a 54 y.o. male presenting on 12/21/2019 for Medical Management of Chronic Issues, Diabetes, Hyperlipidemia, Hypertension, and Fatigue   HPI Type 2 diabetes mellitus Patient comes in today for recheck of his diabetes. Patient has been currently taking Metformin 2000 daily. Patient is currently on an ACE inhibitor/ARB. Patient has not seen an ophthalmologist this year. Patient denies any issues with their feet. The symptom started onset as an adult hypertension and hyperlipidemia and GERD ARE RELATED TO DM   Hypertension Patient is currently on lisinopril hydrochlorothiazide, and their blood pressure today is 128/88. Patient denies any lightheadedness or dizziness. Patient denies headaches, blurred vision, chest pains, shortness of breath, or weakness. Denies any side effects from medication and is content with current medication.   GERD Patient is currently on omeprazole.  She denies any major symptoms or abdominal pain or belching or burping. She denies any blood in her stool or lightheadedness or dizziness.   Hyperlipidemia Patient is coming in for recheck of his hyperlipidemia. The patient is currently taking pravastatin. They deny any issues with myalgias or history of liver damage from it. They deny any focal numbness or weakness or chest pain.   Relevant past medical, surgical, family and social history reviewed and updated as indicated. Interim medical history since our last visit reviewed. Allergies and medications reviewed and updated.  Review of Systems  Constitutional: Negative for chills and fever.  Eyes: Negative for visual disturbance.  Respiratory: Negative for shortness of breath and wheezing.   Cardiovascular: Negative for  chest pain and leg swelling.  Musculoskeletal: Negative for back pain and gait problem.  Skin: Negative for rash.  Neurological: Negative for dizziness, weakness and light-headedness.  All other systems reviewed and are negative.   Per HPI unless specifically indicated above   Allergies as of 12/21/2019   No Known Allergies     Medication List       Accurate as of December 21, 2019  8:12 AM. If you have any questions, ask your nurse or doctor.        allopurinol 300 MG tablet Commonly known as: ZYLOPRIM Take 1 tablet by mouth once daily   HYDROcodone-acetaminophen 5-325 MG tablet Commonly known as: Norco Take 1 tablet by mouth every 4 (four) hours as needed for moderate pain.   IBUPROFEN PO Take by mouth daily as needed.   lisinopril-hydrochlorothiazide 20-25 MG tablet Commonly known as: ZESTORETIC Take 1 tablet by mouth once daily   metFORMIN 500 MG tablet Commonly known as: GLUCOPHAGE Take 2 tablets (1,000 mg total) by mouth 2 (two) times daily with a meal.   multivitamin tablet Take 1 tablet by mouth daily.   omeprazole 40 MG capsule Commonly known as: PRILOSEC Take 1 capsule (40 mg total) by mouth daily.   PARoxetine 20 MG tablet Commonly known as: PAXIL Take 1 tablet (20 mg total) by mouth daily.   pravastatin 20 MG tablet Commonly known as: PRAVACHOL Take 1 tablet by mouth once daily   sildenafil 20 MG tablet Commonly known as: REVATIO Take 1-3 tablets (20-60 mg total) by mouth as needed.  Objective:   BP 128/88   Pulse 83   Temp 97.7 F (36.5 C)   Ht 5\' 10"  (1.778 m)   Wt 220 lb (99.8 kg)   SpO2 97%   BMI 31.57 kg/m   Wt Readings from Last 3 Encounters:  12/21/19 220 lb (99.8 kg)  09/17/19 222 lb (100.7 kg)  06/23/19 215 lb 6.4 oz (97.7 kg)    Physical Exam Vitals and nursing note reviewed.  Constitutional:      General: He is not in acute distress.    Appearance: He is well-developed. He is not diaphoretic.  Eyes:      General: No scleral icterus.    Conjunctiva/sclera: Conjunctivae normal.  Neck:     Thyroid: No thyromegaly.  Cardiovascular:     Rate and Rhythm: Normal rate and regular rhythm.     Heart sounds: Normal heart sounds. No murmur heard.   Pulmonary:     Effort: Pulmonary effort is normal. No respiratory distress.     Breath sounds: Normal breath sounds. No wheezing.  Musculoskeletal:        General: Normal range of motion.     Cervical back: Neck supple.  Lymphadenopathy:     Cervical: No cervical adenopathy.  Skin:    General: Skin is warm and dry.     Findings: No rash.  Neurological:     Mental Status: He is alert and oriented to person, place, and time.     Coordination: Coordination normal.  Psychiatric:        Behavior: Behavior normal.     A1c is 8.3  Assessment & Plan:   Problem List Items Addressed This Visit      Cardiovascular and Mediastinum   Essential hypertension   Relevant Medications   lisinopril-hydrochlorothiazide (ZESTORETIC) 20-25 MG tablet   pravastatin (PRAVACHOL) 20 MG tablet     Digestive   GERD (gastroesophageal reflux disease)   Relevant Medications   omeprazole (PRILOSEC) 40 MG capsule     Endocrine   Hyperlipidemia associated with type 2 diabetes mellitus (HCC)   Relevant Medications   lisinopril-hydrochlorothiazide (ZESTORETIC) 20-25 MG tablet   metFORMIN (GLUCOPHAGE) 500 MG tablet   pravastatin (PRAVACHOL) 20 MG tablet   dapagliflozin propanediol (FARXIGA) 10 MG TABS tablet     Other   GAD (generalized anxiety disorder)   Relevant Medications   PARoxetine (PAXIL) 20 MG tablet    Other Visit Diagnoses    Type 2 diabetes mellitus with other specified complication, without long-term current use of insulin (HCC)    -  Primary   Relevant Medications   lisinopril-hydrochlorothiazide (ZESTORETIC) 20-25 MG tablet   metFORMIN (GLUCOPHAGE) 500 MG tablet   pravastatin (PRAVACHOL) 20 MG tablet   dapagliflozin propanediol (FARXIGA) 10 MG  TABS tablet   Other Relevant Orders   Bayer DCA Hb A1c Waived      A1c is increased so we will add an SGLT2, Jardiance or [place] depending on which one is covered by his insurance. Discussed this was the patient and he is agreeable. Also discussed dietary changes and modifications. Follow up plan: Return in about 3 months (around 03/22/2020), or if symptoms worsen or fail to improve, for Diabetes and hypertension recheck.  Counseling provided for all of the vaccine components Orders Placed This Encounter  Procedures  . Bayer [facility] Hb A1c [place], MD [facility] 12/21/2019, 8:12 AM

## 2019-12-22 ENCOUNTER — Telehealth: Payer: Self-pay | Admitting: *Deleted

## 2019-12-22 MED ORDER — EMPAGLIFLOZIN 25 MG PO TABS: 25.0000 mg | ORAL_TABLET | Freq: Every day | ORAL | 3 refills | Status: DC

## 2019-12-22 NOTE — Telephone Encounter (Signed)
Fax from [place] came in today  [place] is non-preferred by plan   Alternatives include:   Alogliptin Benzoate Tier 1 NOT Required* MetFORMIN HCl Tier 1 NOT Required* Glyxambi Tier 2 NOT Required* Invokana Tier 2 NOT Required* Jardiance Tier 2 NOT Required* Steglatro Tier 2 NOT Required* Januvia Tier 2      NOT Required* Tradjenta Tier 2 NOT Required* Trulicity Tier 2             NOT Required* Lantus SoloStar Tier 2 NOT Required* Levemir FlexTouch Tier 2 NOT Required* Ozempic (0.25 or 0.5 MG/DOSE) Tier 2 NOT Required* Trijardy XR Tier 3 NOT Required* Basaglar KwikPen Tier 3 NOT Required* Rybelsus Tier 3 NOT Required*  Please choose one of these and send to [facility]   Let [facility] pool A know - to call pt.

## 2019-12-22 NOTE — Telephone Encounter (Signed)
Pt returned missed call from nurse regarding change in his Rx. Reviewed Dr Neldon Mc note with pt about Vania Rea Rx being sent in for him instead of the [place] since insurance covered Jardiance more than [place]. Pt voiced understanding.

## 2019-12-22 NOTE — Telephone Encounter (Signed)
Insurance Rep called stating that Alejandro Rivera will also need a PA since pt is already taking Metformin.  Send clinical notes regarding Metformin with the Jardiance   Phone# [phone] (PA Dept)

## 2019-12-22 NOTE — Telephone Encounter (Signed)
Please let the patient know that I sent in Alejandro Rivera because it looks like it is covered better under his insurance than the [place].  He can still use up the samples that I gave him because is essentially the same medicine and once he finishes the samples does get the new medicine.

## 2019-12-23 NOTE — Telephone Encounter (Signed)
I called the PA number on ins card - they were able to help - they are faxing a paper for [place] to fill out and send back - this will state that he is currently on metformin (step-therapy ) they are looking for.   Pt called and aware of what all is going on and aware we will contact him once all gets faxed and approved.

## 2019-12-23 NOTE — Telephone Encounter (Signed)
Epic says he has: [employer] P[employer]   Cover my meds.was started, when insurance was put in - it said pt not identified.  LM for pt to call me back to review his current insurance info- [facility] 11/11

## 2019-12-29 ENCOUNTER — Telehealth: Payer: Self-pay

## 2019-12-29 NOTE — Telephone Encounter (Signed)
Jardiance samples up front for patient, called patient, no answer, left message to call back

## 2019-12-29 NOTE — Telephone Encounter (Signed)
Will tell him he can go ahead and cut the Metformin in half and take a half twice a day and then see if we have any samples for [place] or Farxiga.

## 2019-12-29 NOTE — Telephone Encounter (Signed)
Paper work faxed back with office notes included 12/29/19

## 2019-12-29 NOTE — Telephone Encounter (Signed)
It definitely can be, if he already has the [place] or [place] which I gave him then he can start cutting the Metformin in half and just take a half a dose twice a day and see if that helps.

## 2019-12-29 NOTE — Telephone Encounter (Signed)
PATIENT AWARE

## 2019-12-29 NOTE — Telephone Encounter (Signed)
Patient has not started either of these medications yet, prior auth still in process

## 2020-01-05 NOTE — Telephone Encounter (Signed)
Prior Auth for Farxiga 10mg -[facility] aware.

## 2020-02-02 ENCOUNTER — Telehealth: Payer: BC Managed Care – PPO | Admitting: Family

## 2020-02-02 DIAGNOSIS — B9689 Other specified bacterial agents as the cause of diseases classified elsewhere: Secondary | ICD-10-CM | POA: Diagnosis not present

## 2020-02-02 DIAGNOSIS — R059 Cough, unspecified: Secondary | ICD-10-CM | POA: Diagnosis not present

## 2020-02-02 DIAGNOSIS — J208 Acute bronchitis due to other specified organisms: Secondary | ICD-10-CM

## 2020-02-02 MED ORDER — BENZONATATE 100 MG PO CAPS: 100.0000 mg | ORAL_CAPSULE | Freq: Three times a day (TID) | ORAL | 0 refills | Status: DC | PRN

## 2020-02-02 MED ORDER — DOXYCYCLINE HYCLATE 100 MG PO TABS: 100.0000 mg | ORAL_TABLET | Freq: Two times a day (BID) | ORAL | 0 refills | Status: DC

## 2020-02-02 MED ORDER — PREDNISONE 10 MG (21) PO TBPK: ORAL_TABLET | ORAL | 0 refills | Status: DC

## 2020-02-02 NOTE — Progress Notes (Signed)
We are sorry that you are not feeling well.  Here is how we plan to help!  Based on your presentation I believe you most likely have A cough due to bacteria.  When patients have a fever and a productive cough with a change in color or increased sputum production, we are concerned about bacterial bronchitis.  If left untreated it can progress to pneumonia.  If your symptoms do not improve with your treatment plan it is important that you contact your provider.   I have prescribed Doxycycline 100 mg twice a day for 7 days     In addition you may use A non-prescription cough medication called Robitussin DAC. Take 2 teaspoons every 8 hours or Delsym: take 2 teaspoons every 12 hours., A non-prescription cough medication called Mucinex DM: take 2 tablets every 12 hours. and A prescription cough medication called Tessalon Perles 100mg. You may take 1-2 capsules every 8 hours as needed for your cough.  Prednisone 10 mg daily for 6 days (see taper instructions below)  Directions for 6 day taper: Day 1: 2 tablets before breakfast, 1 after both lunch & dinner and 2 at bedtime Day 2: 1 tab before breakfast, 1 after both lunch & dinner and 2 at bedtime Day 3: 1 tab at each meal & 1 at bedtime Day 4: 1 tab at breakfast, 1 at lunch, 1 at bedtime Day 5: 1 tab at breakfast & 1 tab at bedtime Day 6: 1 tab at breakfast   From your responses in the eVisit questionnaire you describe inflammation in the upper respiratory tract which is causing a significant cough.  This is commonly called Bronchitis and has four common causes:    Allergies  Viral Infections  Acid Reflux  Bacterial Infection Allergies, viruses and acid reflux are treated by controlling symptoms or eliminating the cause. An example might be a cough caused by taking certain blood pressure medications. You stop the cough by changing the medication. Another example might be a cough caused by acid reflux. Controlling the reflux helps control the  cough.  USE OF BRONCHODILATOR ("RESCUE") INHALERS: There is a risk from using your bronchodilator too frequently.  The risk is that over-reliance on a medication which only relaxes the muscles surrounding the breathing tubes can reduce the effectiveness of medications prescribed to reduce swelling and congestion of the tubes themselves.  Although you feel brief relief from the bronchodilator inhaler, your asthma may actually be worsening with the tubes becoming more swollen and filled with mucus.  This can delay other crucial treatments, such as oral steroid medications. If you need to use a bronchodilator inhaler daily, several times per day, you should discuss this with your provider.  There are probably better treatments that could be used to keep your asthma under control.     HOME CARE . Only take medications as instructed by your medical team. . Complete the entire course of an antibiotic. . Drink plenty of fluids and get plenty of rest. . Avoid close contacts especially the very young and the elderly . Cover your mouth if you cough or cough into your sleeve. . Always remember to wash your hands . A steam or ultrasonic humidifier can help congestion.   GET HELP RIGHT AWAY IF: . You develop worsening fever. . You become short of breath . You cough up blood. . Your symptoms persist after you have completed your treatment plan MAKE SURE YOU   Understand these instructions.  Will watch your condition.    Will get help right away if you are not doing well or get worse.  Your e-visit answers were reviewed by a board certified advanced clinical practitioner to complete your personal care plan.  Depending on the condition, your plan could have included both over the counter or prescription medications. If there is a problem please reply  once you have received a response from your provider. Your safety is important to us.  If you have drug allergies check your prescription carefully.     You can use MyChart to ask questions about today's visit, request a non-urgent call back, or ask for a work or school excuse for 24 hours related to this e-Visit. If it has been greater than 24 hours you will need to follow up with your provider, or enter a new e-Visit to address those concerns. You will get an e-mail in the next two days asking about your experience.  I hope that your e-visit has been valuable and will speed your recovery. Thank you for using e-visits.  Approximately 5 minutes was spent documenting and reviewing patient's chart.   

## 2020-02-25 ENCOUNTER — Other Ambulatory Visit: Payer: Self-pay | Admitting: Family Medicine

## 2020-02-25 DIAGNOSIS — E785 Hyperlipidemia, unspecified: Secondary | ICD-10-CM

## 2020-02-25 DIAGNOSIS — I10 Essential (primary) hypertension: Secondary | ICD-10-CM

## 2020-02-25 DIAGNOSIS — E1169 Type 2 diabetes mellitus with other specified complication: Secondary | ICD-10-CM

## 2020-03-09 ENCOUNTER — Other Ambulatory Visit: Payer: Self-pay | Admitting: Family Medicine

## 2020-03-09 DIAGNOSIS — F411 Generalized anxiety disorder: Secondary | ICD-10-CM

## 2020-03-20 ENCOUNTER — Ambulatory Visit: Payer: BC Managed Care – PPO | Admitting: Family Medicine

## 2020-03-20 ENCOUNTER — Encounter: Payer: Self-pay | Admitting: Family Medicine

## 2020-03-20 ENCOUNTER — Other Ambulatory Visit: Payer: Self-pay

## 2020-03-20 VITALS — BP 109/74 | HR 75 | Temp 98.6°F | Resp 20 | Ht 70.0 in | Wt 210.0 lb

## 2020-03-20 DIAGNOSIS — E785 Hyperlipidemia, unspecified: Secondary | ICD-10-CM

## 2020-03-20 DIAGNOSIS — E1169 Type 2 diabetes mellitus with other specified complication: Secondary | ICD-10-CM | POA: Diagnosis not present

## 2020-03-20 DIAGNOSIS — I10 Essential (primary) hypertension: Secondary | ICD-10-CM | POA: Diagnosis not present

## 2020-03-20 DIAGNOSIS — F411 Generalized anxiety disorder: Secondary | ICD-10-CM

## 2020-03-20 DIAGNOSIS — Z23 Encounter for immunization: Secondary | ICD-10-CM

## 2020-03-20 LAB — BAYER DCA HB A1C WAIVED: HB A1C (BAYER DCA - WAIVED): 6.9 % (ref <7.0)

## 2020-03-20 NOTE — Progress Notes (Signed)
BP 109/74   Pulse 75   Temp 98.6 F (37 C)   Resp 20   Ht '5\' 10"'  (1.778 m)   Wt 210 lb (95.3 kg)   SpO2 96%   BMI 30.13 kg/m    Subjective:   Patient ID: Alejandro Rivera, male    DOB: March 29, 1965, 55 y.o.   MRN: [id number]  HPI: Alejandro Rivera is a 55 y.o. male presenting on 03/20/2020 for Medical Management of Chronic Issues, Diabetes, and Hypertension (3 mo )   HPI Type 2 diabetes mellitus Patient comes in today for recheck of his diabetes. Patient has been currently taking [place] and Metformin. Patient is currently on an ACE inhibitor/ARB. Patient has seen an ophthalmologist this year. Patient denies any issues with their feet. The symptom started onset as an adult hypertension and hyperlipidemia ARE RELATED TO DM   Hypertension Patient is currently on lisinopril hydrochlorothiazide, and their blood pressure today is 109/74. Patient denies any lightheadedness or dizziness. Patient denies headaches, blurred vision, chest pains, shortness of breath, or weakness. Denies any side effects from medication and is content with current medication.   Hyperlipidemia Patient is coming in for recheck of his hyperlipidemia. The patient is currently taking pravastatin. They deny any issues with myalgias or history of liver damage from it. They deny any focal numbness or weakness or chest pain.   Anxiety  Patient is coming in for anxiety recheck.  He currently takes Paxil.  He is doing well on it denies any major issues. Depression screen [facility] 2/9 03/20/2020 12/21/2019 09/17/2019 06/18/2019 05/13/2019  Decreased Interest 0 0 0 0 0  Down, Depressed, Hopeless 0 0 0 0 0  PHQ - 2 Score 0 0 0 0 0  Altered sleeping - - - 0 0  Tired, decreased energy - - - 1 0  Change in appetite - - - 0 0  Feeling bad or failure about yourself  - - - 0 0  Trouble concentrating - - - 0 0  Moving slowly or fidgety/restless - - - 0 0  Suicidal thoughts - - - 0 0  PHQ-9 Score - - - 1 0  Difficult doing work/chores - -  - Not difficult at all -     Relevant past medical, surgical, family and social history reviewed and updated as indicated. Interim medical history since our last visit reviewed. Allergies and medications reviewed and updated.  Review of Systems  Constitutional: Negative for chills and fever.  Respiratory: Negative for shortness of breath and wheezing.   Cardiovascular: Negative for chest pain and leg swelling.  Musculoskeletal: Negative for back pain and gait problem.  Skin: Negative for rash.  Neurological: Negative for dizziness, weakness and light-headedness.  All other systems reviewed and are negative.   Per HPI unless specifically indicated above   Allergies as of 03/20/2020   No Known Allergies     Medication List       Accurate as of March 20, 2020  8:46 AM. If you have any questions, ask your nurse or doctor.        STOP taking these medications   benzonatate 100 MG capsule Commonly known as: Best boy Stopped by: Worthy Rancher, MD   doxycycline 100 MG tablet Commonly known as: VIBRA-TABS Stopped by: Worthy Rancher, MD   empagliflozin 25 MG Tabs tablet Commonly known as: Jardiance Stopped by: Worthy Rancher, MD   HYDROcodone-acetaminophen 5-325 MG tablet Commonly known as: Norco Stopped by: Fransisca Kaufmann  Alejandro Forge, MD   predniSONE 10 MG (21) Tbpk tablet Commonly known as: STERAPRED UNI-PAK 21 TAB Stopped by: Fransisca Kaufmann [name], MD     TAKE these medications   allopurinol 300 MG tablet Commonly known as: ZYLOPRIM Take 1 tablet (300 mg total) by mouth daily.   Farxiga 10 MG Tabs tablet Generic drug: dapagliflozin propanediol Take 10 mg by mouth daily.   IBUPROFEN PO Take by mouth daily as needed.   lisinopril-hydrochlorothiazide 20-25 MG tablet Commonly known as: ZESTORETIC Take 1 tablet by mouth once daily   metFORMIN 500 MG tablet Commonly known as: GLUCOPHAGE Take 2 tablets (1,000 mg total) by mouth 2 (two) times daily with  a meal.   multivitamin tablet Take 1 tablet by mouth daily.   omeprazole 40 MG capsule Commonly known as: PRILOSEC Take 1 capsule (40 mg total) by mouth daily.   PARoxetine 20 MG tablet Commonly known as: PAXIL Take 1 tablet by mouth once daily   pravastatin 20 MG tablet Commonly known as: PRAVACHOL Take 1 tablet by mouth once daily   sildenafil 20 MG tablet Commonly known as: REVATIO Take 1-3 tablets (20-60 mg total) by mouth as needed.        Objective:   BP 109/74   Pulse 75   Temp 98.6 F (37 C)   Resp 20   Ht '5\' 10"'  (1.778 m)   Wt 210 lb (95.3 kg)   SpO2 96%   BMI 30.13 kg/m   Wt Readings from Last 3 Encounters:  03/20/20 210 lb (95.3 kg)  12/21/19 220 lb (99.8 kg)  09/17/19 222 lb (100.7 kg)    Physical Exam Vitals and nursing note reviewed.  Constitutional:      General: He is not in acute distress.    Appearance: He is well-developed and well-nourished. He is not diaphoretic.  Eyes:     General: No scleral icterus.    Extraocular Movements: EOM normal.     Conjunctiva/sclera: Conjunctivae normal.  Neck:     Thyroid: No thyromegaly.  Cardiovascular:     Rate and Rhythm: Normal rate and regular rhythm.     Pulses: Intact distal pulses.     Heart sounds: Normal heart sounds. No murmur heard.   Pulmonary:     Effort: Pulmonary effort is normal. No respiratory distress.     Breath sounds: Normal breath sounds. No wheezing.  Musculoskeletal:        General: No edema. Normal range of motion.     Cervical back: Neck supple.  Lymphadenopathy:     Cervical: No cervical adenopathy.  Skin:    General: Skin is warm and dry.     Findings: No rash.  Neurological:     Mental Status: He is alert and oriented to person, place, and time.     Coordination: Coordination normal.  Psychiatric:        Mood and Affect: Mood and affect normal.        Behavior: Behavior normal.       Assessment & Plan:   Problem List Items Addressed This Visit       Cardiovascular and Mediastinum   Essential hypertension   Relevant Orders   CBC with Differential/Platelet   CMP14+EGFR     Endocrine   Hyperlipidemia associated with type 2 diabetes mellitus ([place])   Relevant Medications   dapagliflozin propanediol (FARXIGA) 10 MG TABS tablet   Other Relevant Orders   CBC with Differential/Platelet   Lipid panel     Other  GAD (generalized anxiety disorder)    Other Visit Diagnoses    Type 2 diabetes mellitus with other specified complication, without long-term current use of insulin ([place])    -  Primary   Relevant Medications   dapagliflozin propanediol (FARXIGA) 10 MG TABS tablet   Other Relevant Orders   Bayer DCA Hb A1c Waived   CBC with Differential/Platelet      Continue current medication, seems to be doing well.  We will check blood work today Follow up plan: Return in about 3 months (around 06/17/2020), or if symptoms worsen or fail to improve, for Diabetes hypertension cholesterol.  Counseling provided for all of the vaccine components Orders Placed This Encounter  Procedures  . Pneumococcal conjugate vaccine 13-valent  . Bayer DCA Hb A1c Waived  . CBC with Differential/Platelet  . CMP14+EGFR  . Lipid panel    Caryl Pina, MD [facility] 03/20/2020, 8:46 AM

## 2020-03-21 LAB — CMP14+EGFR
ALT: 15 IU/L (ref 0–44)
AST: 15 IU/L (ref 0–40)
Albumin/Globulin Ratio: 1.7 (ref 1.2–2.2)
Albumin: 4.5 g/dL (ref 3.8–4.9)
Alkaline Phosphatase: 85 IU/L (ref 44–121)
BUN/Creatinine Ratio: 18 (ref 9–20)
BUN: 19 mg/dL (ref 6–24)
Bilirubin Total: 0.3 mg/dL (ref 0.0–1.2)
CO2: 22 mmol/L (ref 20–29)
Calcium: 9.9 mg/dL (ref 8.7–10.2)
Chloride: 103 mmol/L (ref 96–106)
Creatinine, Ser: 1.07 mg/dL (ref 0.76–1.27)
GFR calc Af Amer: 89 mL/min/{1.73_m2} (ref >59)
GFR calc non Af Amer: 77 mL/min/{1.73_m2} (ref >59)
Globulin, Total: 2.6 g/dL (ref 1.5–4.5)
Glucose: 154 mg/dL — ABNORMAL HIGH (ref 65–99)
Potassium: 4.7 mmol/L (ref 3.5–5.2)
Sodium: 141 mmol/L (ref 134–144)
Total Protein: 7.1 g/dL (ref 6.0–8.5)

## 2020-03-21 LAB — CBC WITH DIFFERENTIAL/PLATELET
Basophils Absolute: 0.1 10*3/uL (ref 0.0–0.2)
Basos: 1 %
EOS (ABSOLUTE): 0.2 10*3/uL (ref 0.0–0.4)
Eos: 2 %
Hematocrit: 48 % (ref 37.5–51.0)
Hemoglobin: 16 g/dL (ref 13.0–17.7)
Immature Grans (Abs): 0 10*3/uL (ref 0.0–0.1)
Immature Granulocytes: 0 %
Lymphocytes Absolute: 1.9 10*3/uL (ref 0.7–3.1)
Lymphs: 21 %
MCH: 31.1 pg (ref 26.6–33.0)
MCHC: 33.3 g/dL (ref 31.5–35.7)
MCV: 93 fL (ref 79–97)
Monocytes Absolute: 0.6 10*3/uL (ref 0.1–0.9)
Monocytes: 6 %
Neutrophils Absolute: 6.3 10*3/uL (ref 1.4–7.0)
Neutrophils: 70 %
Platelets: 321 10*3/uL (ref 150–450)
RBC: 5.15 x10E6/uL (ref 4.14–5.80)
RDW: 12.4 % (ref 11.6–15.4)
WBC: 9 10*3/uL (ref 3.4–10.8)

## 2020-03-21 LAB — LIPID PANEL
Chol/HDL Ratio: 4 ratio (ref 0.0–5.0)
Cholesterol, Total: 194 mg/dL (ref 100–199)
HDL: 49 mg/dL (ref >39)
LDL Chol Calc (NIH): 92 mg/dL (ref 0–99)
Triglycerides: 319 mg/dL — ABNORMAL HIGH (ref 0–149)
VLDL Cholesterol Cal: 53 mg/dL — ABNORMAL HIGH (ref 5–40)

## 2020-03-29 DIAGNOSIS — H524 Presbyopia: Secondary | ICD-10-CM | POA: Diagnosis not present

## 2020-03-29 DIAGNOSIS — H5213 Myopia, bilateral: Secondary | ICD-10-CM | POA: Diagnosis not present

## 2020-03-29 LAB — HM DIABETES EYE EXAM

## 2020-05-27 ENCOUNTER — Other Ambulatory Visit: Payer: Self-pay | Admitting: Family Medicine

## 2020-05-27 DIAGNOSIS — K219 Gastro-esophageal reflux disease without esophagitis: Secondary | ICD-10-CM

## 2020-05-27 DIAGNOSIS — I10 Essential (primary) hypertension: Secondary | ICD-10-CM

## 2020-06-22 ENCOUNTER — Other Ambulatory Visit: Payer: Self-pay

## 2020-06-22 ENCOUNTER — Encounter: Payer: Self-pay | Admitting: Family Medicine

## 2020-06-22 ENCOUNTER — Ambulatory Visit: Payer: BC Managed Care – PPO | Admitting: Family Medicine

## 2020-06-22 VITALS — BP 122/84 | HR 78 | Ht 70.0 in | Wt 210.0 lb

## 2020-06-22 DIAGNOSIS — E785 Hyperlipidemia, unspecified: Secondary | ICD-10-CM | POA: Diagnosis not present

## 2020-06-22 DIAGNOSIS — E1169 Type 2 diabetes mellitus with other specified complication: Secondary | ICD-10-CM | POA: Diagnosis not present

## 2020-06-22 DIAGNOSIS — I10 Essential (primary) hypertension: Secondary | ICD-10-CM

## 2020-06-22 DIAGNOSIS — F411 Generalized anxiety disorder: Secondary | ICD-10-CM | POA: Diagnosis not present

## 2020-06-22 LAB — BAYER DCA HB A1C WAIVED: HB A1C (BAYER DCA - WAIVED): 7 % — ABNORMAL HIGH (ref <7.0)

## 2020-06-22 MED ORDER — LISINOPRIL-HYDROCHLOROTHIAZIDE 20-25 MG PO TABS
1.0000 | ORAL_TABLET | Freq: Every day | ORAL | 3 refills | Status: DC
Start: 2020-06-22 — End: 2021-02-26

## 2020-06-22 MED ORDER — PRAVASTATIN SODIUM 20 MG PO TABS: 20.0000 mg | ORAL_TABLET | Freq: Every day | ORAL | 3 refills | Status: DC

## 2020-06-22 MED ORDER — PAROXETINE HCL 20 MG PO TABS: 20.0000 mg | ORAL_TABLET | Freq: Every day | ORAL | 3 refills | Status: DC

## 2020-06-22 NOTE — Progress Notes (Signed)
BP 122/84   Pulse 78   Ht 5\' 10"  (1.778 m)   Wt 210 lb (95.3 kg)   SpO2 97%   BMI 30.13 kg/m    Subjective:   Patient ID: Alejandro Rivera, male    DOB: 26-Sep-1965, 55 y.o.   MRN: [id number]  HPI: Alejandro Rivera is a 55 y.o. male presenting on 06/22/2020 for Medical Management of Chronic Issues and Diabetes   HPI Type 2 diabetes mellitus Patient comes in today for recheck of his diabetes. Patient has been currently taking metformin and [place]. Patient is currently on an ACE inhibitor/ARB. Patient has seen an ophthalmologist this year. Patient denies any issues with their feet. The symptom started onset as an adult hypertension and hyperlipidemia ARE RELATED TO DM   Hypertension Patient is currently on lisinopril hydrochlorothiazide, and their blood pressure today is 122/84. Patient denies any lightheadedness or dizziness. Patient denies headaches, blurred vision, chest pains, shortness of breath, or weakness. Denies any side effects from medication and is content with current medication.   Hyperlipidemia Patient is coming in for recheck of his hyperlipidemia. The patient is currently taking fish oil and pravastatin. They deny any issues with myalgias or history of liver damage from it. They deny any focal numbness or weakness or chest pain.   Patient has some burning itching in between his toes and on the top of his feet.  He says it happens sometimes specially when he is wearing his closed toe work boots.  He says is not there all the time and sometimes he gets little blisters most the time no rash.  He says also very pruritic as well.  Relevant past medical, surgical, family and social history reviewed and updated as indicated. Interim medical history since our last visit reviewed. Allergies and medications reviewed and updated.  Review of Systems  Constitutional: Negative for chills and fever.  Respiratory: Negative for shortness of breath and wheezing.   Cardiovascular:  Negative for chest pain and leg swelling.  Musculoskeletal: Negative for back pain and gait problem.  Skin: Negative for color change and rash.  Neurological: Negative for weakness and numbness.  All other systems reviewed and are negative.   Per HPI unless specifically indicated above   Allergies as of 06/22/2020   No Known Allergies     Medication List       Accurate as of Jun 22, 2020  8:28 AM. If you have any questions, ask your nurse or doctor.        allopurinol 300 MG tablet Commonly known as: ZYLOPRIM Take 1 tablet (300 mg total) by mouth daily.   dapagliflozin propanediol 10 MG Tabs tablet Commonly known as: FARXIGA Take 10 mg by mouth daily.   Fish Oil 1000 MG Caps Take by mouth.   IBUPROFEN PO Take by mouth daily as needed.   lisinopril-hydrochlorothiazide 20-25 MG tablet Commonly known as: ZESTORETIC Take 1 tablet by mouth daily.   metFORMIN 500 MG tablet Commonly known as: GLUCOPHAGE Take 2 tablets (1,000 mg total) by mouth 2 (two) times daily with a meal.   multivitamin tablet Take 1 tablet by mouth daily.   omeprazole 40 MG capsule Commonly known as: PRILOSEC Take 1 capsule by mouth once daily   PARoxetine 20 MG tablet Commonly known as: PAXIL Take 1 tablet (20 mg total) by mouth daily.   pravastatin 20 MG tablet Commonly known as: PRAVACHOL Take 1 tablet (20 mg total) by mouth daily.   sildenafil 20 MG tablet  Commonly known as: REVATIO Take 1-3 tablets (20-60 mg total) by mouth as needed.        Objective:   BP 122/84   Pulse 78   Ht 5\' 10"  (1.778 m)   Wt 210 lb (95.3 kg)   SpO2 97%   BMI 30.13 kg/m   Wt Readings from Last 3 Encounters:  06/22/20 210 lb (95.3 kg)  03/20/20 210 lb (95.3 kg)  12/21/19 220 lb (99.8 kg)    Physical Exam Vitals and nursing note reviewed.  Constitutional:      General: He is not in acute distress.    Appearance: He is well-developed. He is not diaphoretic.  Eyes:     General: No scleral  icterus.    Conjunctiva/sclera: Conjunctivae normal.  Neck:     Thyroid: No thyromegaly.  Cardiovascular:     Rate and Rhythm: Normal rate and regular rhythm.     Heart sounds: Normal heart sounds. No murmur heard.   Pulmonary:     Effort: Pulmonary effort is normal. No respiratory distress.     Breath sounds: Normal breath sounds. No wheezing.  Musculoskeletal:        General: Normal range of motion.     Cervical back: Neck supple.  Lymphadenopathy:     Cervical: No cervical adenopathy.  Skin:    General: Skin is warm and dry.     Findings: No lesion or rash.  Neurological:     Mental Status: He is alert and oriented to person, place, and time.     Coordination: Coordination normal.  Psychiatric:        Behavior: Behavior normal.     Results for orders placed or performed in visit on 04/03/20  HM DIABETES EYE EXAM  Result Value Ref Range   HM Diabetic Eye Exam No Retinopathy No Retinopathy    Assessment & Plan:   Problem List Items Addressed This Visit      Cardiovascular and Mediastinum   Essential hypertension   Relevant Medications   pravastatin (PRAVACHOL) 20 MG tablet   lisinopril-hydrochlorothiazide (ZESTORETIC) 20-25 MG tablet     Endocrine   Hyperlipidemia associated with type 2 diabetes mellitus ([facility])   Relevant Medications   pravastatin (PRAVACHOL) 20 MG tablet   lisinopril-hydrochlorothiazide (ZESTORETIC) 20-25 MG tablet     Other   GAD (generalized anxiety disorder)   Relevant Medications   PARoxetine (PAXIL) 20 MG tablet    Other Visit Diagnoses    Type 2 diabetes mellitus with other specified complication, without long-term current use of insulin ([facility])    -  Primary   Relevant Medications   pravastatin (PRAVACHOL) 20 MG tablet   lisinopril-hydrochlorothiazide (ZESTORETIC) 20-25 MG tablet   Other Relevant Orders   Bayer DCA Hb A1c Waived      A1c 7.0 which is up slightly but still within a good range, no change in medication just focus on  diet and exercise Follow up plan: Return in about 3 months (around 09/22/2020), or if symptoms worsen or fail to improve, for Diabetes and hypertension and cholesterol.  Counseling provided for all of the vaccine components Orders Placed This Encounter  Procedures  . Bayer [facility] Hb A1c [place], MD [facility] 06/22/2020, 8:28 AM

## 2020-07-26 DIAGNOSIS — N35811 Other urethral stricture, male, meatal: Secondary | ICD-10-CM | POA: Diagnosis not present

## 2020-07-26 DIAGNOSIS — N5201 Erectile dysfunction due to arterial insufficiency: Secondary | ICD-10-CM | POA: Diagnosis not present

## 2020-07-27 ENCOUNTER — Other Ambulatory Visit: Payer: Self-pay | Admitting: Urology

## 2020-08-17 ENCOUNTER — Other Ambulatory Visit: Payer: Self-pay

## 2020-08-17 ENCOUNTER — Encounter (HOSPITAL_BASED_OUTPATIENT_CLINIC_OR_DEPARTMENT_OTHER): Payer: Self-pay | Admitting: Urology

## 2020-08-17 DIAGNOSIS — R200 Anesthesia of skin: Secondary | ICD-10-CM

## 2020-08-17 DIAGNOSIS — E119 Type 2 diabetes mellitus without complications: Secondary | ICD-10-CM

## 2020-08-17 DIAGNOSIS — N35919 Unspecified urethral stricture, male, unspecified site: Secondary | ICD-10-CM

## 2020-08-17 DIAGNOSIS — I10 Essential (primary) hypertension: Secondary | ICD-10-CM

## 2020-08-17 DIAGNOSIS — E785 Hyperlipidemia, unspecified: Secondary | ICD-10-CM

## 2020-08-17 HISTORY — DX: Type 2 diabetes mellitus without complications: E11.9

## 2020-08-17 HISTORY — DX: Unspecified urethral stricture, male, unspecified site: N35.919

## 2020-08-17 HISTORY — DX: Anesthesia of skin: R20.0

## 2020-08-17 HISTORY — DX: Essential (primary) hypertension: I10

## 2020-08-17 HISTORY — DX: Hyperlipidemia, unspecified: E78.5

## 2020-08-17 NOTE — Progress Notes (Signed)
Spoke w/ via phone for pre-op interview---pt Lab needs dos---- I stat and ekg              Lab results------ekg 06-23-2019 epic COVID test -----patient states asymptomatic no test needed Arrive at -------1030 am 08-21-2020 NPO after MN NO Solid Food.  Clear liquids from MN until---930 am then npo Med rec completed Medications to take morning of surgery -----omeprazole, paxil, pravastatin, allopurinol Diabetic medication -----none day of surgery Patient instructed no nail polish to be worn day of surgery Patient instructed to bring photo id and insurance card day of surgery Patient aware to have Driver (ride ) / caregiver   wife wendy Hueber will stay  for 24 hours after surgery  Patient Special Instructions -----none Pre-Op special Istructions -----none Patient verbalized understanding of instructions that were given at this phone interview. Patient denies shortness of breath, chest pain, fever, cough at this phone interview.

## 2020-08-20 NOTE — Anesthesia Preprocedure Evaluation (Addendum)
Anesthesia Evaluation  Patient identified by MRN, date of birth, ID band Patient awake    Reviewed: Allergy & Precautions, NPO status , Patient's Chart, lab work & pertinent test results  Airway Mallampati: II  TM Distance: >3 FB Neck ROM: Full    Dental no notable dental hx. (+) Teeth Intact, Dental Advisory Given   Pulmonary neg pulmonary ROS,    Pulmonary exam normal breath sounds clear to auscultation       Cardiovascular Exercise Tolerance: Good hypertension, Pt. on medications negative cardio ROS Normal cardiovascular exam Rhythm:Regular Rate:Normal     Neuro/Psych negative neurological ROS     GI/Hepatic Neg liver ROS, GERD  ,  Endo/Other  diabetes, Type 2  Renal/GU negative Renal ROSLab Results      Component                Value               Date                      CREATININE               0.90                08/21/2020                BUN                      25 (H)              08/21/2020                NA                       138                 08/21/2020                K                        5.1                 08/21/2020                CL                       103                 08/21/2020                CO2                      22                  03/20/2020                negative genitourinary   Musculoskeletal  (+) Arthritis ,   Abdominal   Peds  Hematology Lab Results      Component                Value               Date                      WBC  9.0                 03/20/2020                HGB                      17.7 (H)            08/21/2020                HCT                      52.0                08/21/2020                MCV                      93                  03/20/2020                PLT                      321                 03/20/2020              Anesthesia Other Findings   Reproductive/Obstetrics                            Anesthesia Physical Anesthesia Plan  ASA: 3  Anesthesia Plan: General   Post-op Pain Management:    Induction: Intravenous  PONV Risk Score and Plan: 3 and Treatment may vary due to age or medical condition, Ondansetron and Dexamethasone  Airway Management Planned: LMA  Additional Equipment: None  Intra-op Plan:   Post-operative Plan:   Informed Consent: I have reviewed the patients History and Physical, chart, labs and discussed the procedure including the risks, benefits and alternatives for the proposed anesthesia with the patient or authorized representative who has indicated his/her understanding and acceptance.     Dental advisory given  Plan Discussed with: CRNA  Anesthesia Plan Comments:        Anesthesia Quick Evaluation

## 2020-08-21 ENCOUNTER — Ambulatory Visit (HOSPITAL_BASED_OUTPATIENT_CLINIC_OR_DEPARTMENT_OTHER)
Admission: RE | Admit: 2020-08-21 | Discharge: 2020-08-21 | Disposition: A | Discharge status: Home / Self Care | Payer: BC Managed Care – PPO | Attending: Urology | Admitting: Urology

## 2020-08-21 ENCOUNTER — Ambulatory Visit (HOSPITAL_BASED_OUTPATIENT_CLINIC_OR_DEPARTMENT_OTHER): Payer: BC Managed Care – PPO | Admitting: Anesthesiology

## 2020-08-21 ENCOUNTER — Encounter (HOSPITAL_BASED_OUTPATIENT_CLINIC_OR_DEPARTMENT_OTHER)
Admission: RE | Disposition: A | Discharge status: Home / Self Care | Payer: Self-pay | Source: Home / Self Care | Attending: Urology

## 2020-08-21 ENCOUNTER — Other Ambulatory Visit: Payer: Self-pay

## 2020-08-21 ENCOUNTER — Encounter (HOSPITAL_BASED_OUTPATIENT_CLINIC_OR_DEPARTMENT_OTHER): Payer: Self-pay | Admitting: Urology

## 2020-08-21 DIAGNOSIS — Z7984 Long term (current) use of oral hypoglycemic drugs: Secondary | ICD-10-CM | POA: Diagnosis not present

## 2020-08-21 DIAGNOSIS — N5201 Erectile dysfunction due to arterial insufficiency: Secondary | ICD-10-CM | POA: Diagnosis not present

## 2020-08-21 DIAGNOSIS — Z79899 Other long term (current) drug therapy: Secondary | ICD-10-CM | POA: Diagnosis not present

## 2020-08-21 DIAGNOSIS — F411 Generalized anxiety disorder: Secondary | ICD-10-CM | POA: Diagnosis not present

## 2020-08-21 DIAGNOSIS — E119 Type 2 diabetes mellitus without complications: Secondary | ICD-10-CM | POA: Insufficient documentation

## 2020-08-21 DIAGNOSIS — N35911 Unspecified urethral stricture, male, meatal: Secondary | ICD-10-CM | POA: Insufficient documentation

## 2020-08-21 DIAGNOSIS — N35811 Other urethral stricture, male, meatal: Secondary | ICD-10-CM | POA: Diagnosis not present

## 2020-08-21 DIAGNOSIS — I10 Essential (primary) hypertension: Secondary | ICD-10-CM | POA: Diagnosis not present

## 2020-08-21 DIAGNOSIS — K219 Gastro-esophageal reflux disease without esophagitis: Secondary | ICD-10-CM | POA: Diagnosis not present

## 2020-08-21 HISTORY — PX: CYSTOSCOPY WITH URETHRAL DILATATION: SHX5125

## 2020-08-21 HISTORY — DX: Gout, unspecified: M10.9

## 2020-08-21 HISTORY — DX: Unspecified osteoarthritis, unspecified site: M19.90

## 2020-08-21 LAB — POCT I-STAT, CHEM 8
BUN: 25 mg/dL — ABNORMAL HIGH (ref 6–20)
Calcium, Ion: 1.24 mmol/L (ref 1.15–1.40)
Chloride: 103 mmol/L (ref 98–111)
Creatinine, Ser: 0.9 mg/dL (ref 0.61–1.24)
Glucose, Bld: 147 mg/dL — ABNORMAL HIGH (ref 70–99)
HCT: 52 % (ref 39.0–52.0)
Hemoglobin: 17.7 g/dL — ABNORMAL HIGH (ref 13.0–17.0)
Potassium: 5.1 mmol/L (ref 3.5–5.1)
Sodium: 138 mmol/L (ref 135–145)
TCO2: 26 mmol/L (ref 22–32)

## 2020-08-21 LAB — GLUCOSE, CAPILLARY: Glucose-Capillary: 115 mg/dL — ABNORMAL HIGH (ref 70–99)

## 2020-08-21 SURGERY — CYSTOSCOPY, WITH URETHRAL DILATION
Anesthesia: General | Site: Bladder

## 2020-08-21 MED ORDER — ONDANSETRON HCL 4 MG/2ML IJ SOLN
INTRAMUSCULAR | Status: DC | PRN
  Administered 2020-08-21: 4 mg via INTRAVENOUS

## 2020-08-21 MED ORDER — ONDANSETRON HCL 4 MG/2ML IJ SOLN: 4.0000 mg | Freq: Once | INTRAMUSCULAR | Status: DC | PRN

## 2020-08-21 MED ORDER — PROPOFOL 10 MG/ML IV BOLUS
INTRAVENOUS | Status: AC
  Filled 2020-08-21: qty 20

## 2020-08-21 MED ORDER — PROPOFOL 10 MG/ML IV BOLUS
INTRAVENOUS | Status: DC | PRN
  Administered 2020-08-21: 180 mg via INTRAVENOUS

## 2020-08-21 MED ORDER — HYDROMORPHONE HCL 1 MG/ML IJ SOLN: 0.2500 mg | INTRAMUSCULAR | Status: DC | PRN

## 2020-08-21 MED ORDER — DEXAMETHASONE SODIUM PHOSPHATE 10 MG/ML IJ SOLN
INTRAMUSCULAR | Status: AC
  Filled 2020-08-21: qty 1

## 2020-08-21 MED ORDER — CEFAZOLIN SODIUM-DEXTROSE 2-4 GM/100ML-% IV SOLN
2.0000 g | INTRAVENOUS | Status: AC
  Administered 2020-08-21: 2 g via INTRAVENOUS

## 2020-08-21 MED ORDER — CEFAZOLIN SODIUM-DEXTROSE 2-4 GM/100ML-% IV SOLN
INTRAVENOUS | Status: AC
  Filled 2020-08-21: qty 100

## 2020-08-21 MED ORDER — DEXAMETHASONE SODIUM PHOSPHATE 10 MG/ML IJ SOLN
INTRAMUSCULAR | Status: DC | PRN
  Administered 2020-08-21: 10 mg via INTRAVENOUS

## 2020-08-21 MED ORDER — OXYCODONE HCL 5 MG/5ML PO SOLN: 5.0000 mg | Freq: Once | ORAL | Status: DC | PRN

## 2020-08-21 MED ORDER — LIDOCAINE HCL (PF) 2 % IJ SOLN
INTRAMUSCULAR | Status: AC
  Filled 2020-08-21: qty 5

## 2020-08-21 MED ORDER — HYDROCODONE-ACETAMINOPHEN 5-325 MG PO TABS: 1.0000 | ORAL_TABLET | ORAL | 0 refills | Status: DC | PRN

## 2020-08-21 MED ORDER — ACETAMINOPHEN 10 MG/ML IV SOLN: 1000.0000 mg | Freq: Once | INTRAVENOUS | Status: DC | PRN

## 2020-08-21 MED ORDER — 0.9 % SODIUM CHLORIDE (POUR BTL) OPTIME
TOPICAL | Status: DC | PRN
  Administered 2020-08-21: 500 mL

## 2020-08-21 MED ORDER — ONDANSETRON HCL 4 MG/2ML IJ SOLN
INTRAMUSCULAR | Status: AC
  Filled 2020-08-21: qty 2

## 2020-08-21 MED ORDER — FENTANYL CITRATE (PF) 100 MCG/2ML IJ SOLN
INTRAMUSCULAR | Status: DC | PRN
  Administered 2020-08-21: 25 ug via INTRAVENOUS
  Administered 2020-08-21: 50 ug via INTRAVENOUS
  Administered 2020-08-21: 25 ug via INTRAVENOUS

## 2020-08-21 MED ORDER — MIDAZOLAM HCL 5 MG/5ML IJ SOLN
INTRAMUSCULAR | Status: DC | PRN
  Administered 2020-08-21: 2 mg via INTRAVENOUS

## 2020-08-21 MED ORDER — AMISULPRIDE (ANTIEMETIC) 5 MG/2ML IV SOLN: 10.0000 mg | Freq: Once | INTRAVENOUS | Status: DC | PRN

## 2020-08-21 MED ORDER — LACTATED RINGERS IV SOLN: INTRAVENOUS | Status: DC

## 2020-08-21 MED ORDER — STERILE WATER FOR IRRIGATION IR SOLN
Status: DC | PRN
  Administered 2020-08-21: 3000 mL

## 2020-08-21 MED ORDER — MIDAZOLAM HCL 2 MG/2ML IJ SOLN
INTRAMUSCULAR | Status: AC
  Filled 2020-08-21: qty 2

## 2020-08-21 MED ORDER — OXYCODONE HCL 5 MG PO TABS: 5.0000 mg | ORAL_TABLET | Freq: Once | ORAL | Status: DC | PRN

## 2020-08-21 MED ORDER — LIDOCAINE 2% (20 MG/ML) 5 ML SYRINGE
INTRAMUSCULAR | Status: DC | PRN
  Administered 2020-08-21: 100 mg via INTRAVENOUS

## 2020-08-21 MED ORDER — FENTANYL CITRATE (PF) 100 MCG/2ML IJ SOLN
INTRAMUSCULAR | Status: AC
  Filled 2020-08-21: qty 2

## 2020-08-21 SURGICAL SUPPLY — 35 items
BAG DRAIN URO-CYSTO SKYTR STRL (DRAIN) ×2 IMPLANT
BAG DRN RND TRDRP ANRFLXCHMBR (UROLOGICAL SUPPLIES) ×1
BAG DRN UROCATH (DRAIN) ×1
BAG URINE DRAIN 2000ML AR STRL (UROLOGICAL SUPPLIES) ×2 IMPLANT
BALLN NEPHROSTOMY (BALLOONS)
BALLN OPTILUME DCB 24X3X75 (BALLOONS) ×2
BALLOON NEPHROSTOMY (BALLOONS) IMPLANT
BALLOON OPTILUME DCB 24X3X75 (BALLOONS) ×1 IMPLANT
CATH FOLEY 2WAY SLVR  5CC 14FR (CATHETERS)
CATH FOLEY 2WAY SLVR  5CC 16FR (CATHETERS)
CATH FOLEY 2WAY SLVR  5CC 18FR (CATHETERS) ×1
CATH FOLEY 2WAY SLVR 5CC 14FR (CATHETERS) IMPLANT
CATH FOLEY 2WAY SLVR 5CC 16FR (CATHETERS) IMPLANT
CATH FOLEY 2WAY SLVR 5CC 18FR (CATHETERS) ×1 IMPLANT
CLOTH BEACON ORANGE TIMEOUT ST (SAFETY) ×4 IMPLANT
ELECT REM PT RETURN 9FT ADLT (ELECTROSURGICAL)
ELECTRODE REM PT RTRN 9FT ADLT (ELECTROSURGICAL) IMPLANT
GLOVE SURG ENC MOIS LTX SZ7.5 (GLOVE) ×2 IMPLANT
GLOVE SURG UNDER POLY LF SZ7 (GLOVE) ×2 IMPLANT
GLOVE SURG UNDER POLY LF SZ7.5 (GLOVE) ×2 IMPLANT
GOWN STRL REUS W/TWL LRG LVL3 (GOWN DISPOSABLE) ×8 IMPLANT
GUIDEWIRE STR DUAL SENSOR (WIRE) IMPLANT
HOLDER FOLEY CATH W/STRAP (MISCELLANEOUS) ×2 IMPLANT
IV NS IRRIG 3000ML ARTHROMATIC (IV SOLUTION) IMPLANT
KIT TURNOVER CYSTO (KITS) ×2 IMPLANT
MANIFOLD NEPTUNE II (INSTRUMENTS) ×2 IMPLANT
NDL SAFETY ECLIPSE 18X1.5 (NEEDLE) IMPLANT
NEEDLE HYPO 18GX1.5 SHARP (NEEDLE)
NEEDLE HYPO 22GX1.5 SAFETY (NEEDLE) IMPLANT
NS IRRIG 500ML POUR BTL (IV SOLUTION) IMPLANT
PACK CYSTO (CUSTOM PROCEDURE TRAY) ×2 IMPLANT
SYR 20ML LL LF (SYRINGE) IMPLANT
SYR 30ML LL (SYRINGE) IMPLANT
TUBE CONNECTING 12X1/4 (SUCTIONS) IMPLANT
WATER STERILE IRR 3000ML UROMA (IV SOLUTION) ×4 IMPLANT

## 2020-08-21 NOTE — Anesthesia Procedure Notes (Signed)
Procedure Name: LMA Insertion Date/Time: 08/21/2020 1:38 PM Performed by: Genelle Bal, CRNA Pre-anesthesia Checklist: Patient identified, Emergency Drugs available, Suction available and Patient being monitored Patient Re-evaluated:Patient Re-evaluated prior to induction Oxygen Delivery Method: Circle system utilized Preoxygenation: Pre-oxygenation with 100% oxygen Induction Type: IV induction Ventilation: Mask ventilation without difficulty LMA: LMA inserted LMA Size: 5.0 Number of attempts: 1 Airway Equipment and Method: Bite block Placement Confirmation: positive ETCO2 Tube secured with: Tape Dental Injury: Teeth and Oropharynx as per pre-operative assessment

## 2020-08-21 NOTE — H&P (Addendum)
CC/HPI: CC: Balanitis, meatal stenosis, weak stream  HPI:  55 year old male who had a circumcision when he was young. He is diabetic. He has been having problems with excess foreskin and balanitis. He also has weak stream. Diabetes is under moderate control. Last A1c was 8.1. Last PSA was in 2018 and was 0.6. He is due for another PSA. Erections hurt due to the excess foreskin and adhesions. He does have some mild erectile dysfunction that he attributes to the pain from erections.   06/29/19:  PAtient with above noted past medical history. Presents today for voiding trial after circumcision and meatal dilation on 06/23/19. During the procedure he was found to have a distal urethral stricture and mild hypospadias. He also had 2 condylomas removed. He tolerated the procedure very well and there were no complications. He was discharged with a catheter postoperatively. He has tolerated the catheter well. Denies gross hematuria, fevers, chills, nausea, vomiting and flank pain. He does endorse some mild irritation at the head of the penis.   07/23/2019: Successful trial of void at last office visit. Patient continues to have good force of stream with no problems starting or stopping. He has stable daytime frequency but no significant urgency or bothersome nocturia. He denies incontinence. No longer having any penile discomfort or pain. Irritation and discomfort at the glans penis as well as the meatus have resolved. He has had no further issue with surgical incision wound healing. He reports being quite satisfied with the results of his recent procedure.   07/26/2020  Patient has some erectile dysfunction that responds well to Cialis. He would like to purchase some today. In regards to his voiding, he feels like he has some postvoid dribbling, split stream, some pressure to void.     CC: AUA Questions Scoring.  HPI: Alejandro Rivera is a 55 year-old male established patient who is here AUA Questions.       AUA Symptom Score: He never has the sensation of not emptying his bladder completely after finishing urinating. Less than 20% of the time he has to urinate again fewer than two hours after he has finished urinating. He does not have to stop and start again several times when he urinates. He never finds it difficult to postpone urination. He never has a weak urinary stream. Less than 20% of the time he has to push or strain to begin urination. He has to get up to urinate 2 times from the time he goes to bed until the time he gets up in the morning.   Calculated AUA Symptom Score: 4    ALLERGIES: No Known Allergies    MEDICATIONS: Allopurinol  Lisinopril  Metformin Hcl  Omeprazole  Farxiga  Paroxetine Er  Pravastatin Sodium     GU PSH: Cysto Dilate Stricture (M or F) - 06/23/2019 Exc H-f-nk-sp B9+marg 1.1-2 - 06/23/2019 Lysis Penil Circumic Lesion - 06/23/2019 Non-Newborn Circumcision - 06/23/2019     NON-GU PSH: Remove Gallbladder     GU PMH: Balanitis - 06/15/2019 Encounter for Prostate Cancer screening - 06/15/2019 Penile adhesions - 06/15/2019    NON-GU PMH: Other urethral stricture, male, meatal - 06/15/2019 Anxiety Arthritis Diabetes Type 2 GERD Gout Hypercholesterolemia Hypertension Sleep Apnea    FAMILY HISTORY: 2 daughters - Other Cancer - Aunt Diabetes - Mother Heart Disease - Uncle Hypertension - Uncle   SOCIAL HISTORY: Marital Status: Married Preferred Language: English Current Smoking Status: Patient has never smoked.   Tobacco Use Assessment Completed: Used Tobacco in last  30 days? Drinks 3 drinks per week.  Does not use drugs. Drinks 2 caffeinated drinks per day. Patient's occupation Agricultural consultant.    REVIEW OF SYSTEMS:    GU Review Male:   Patient denies frequent urination, hard to postpone urination, burning/ pain with urination, get up at night to urinate, leakage of urine, stream starts and stops, trouble starting your stream, have to strain to  urinate , erection problems, and penile pain.  Gastrointestinal (Upper):   Patient denies nausea, vomiting, and indigestion/ heartburn.  Gastrointestinal (Lower):   Patient denies diarrhea and constipation.  Constitutional:   Patient denies fever, night sweats, weight loss, and fatigue.  Skin:   Patient denies skin rash/ lesion and itching.  Eyes:   Patient denies blurred vision and double vision.  Ears/ Nose/ Throat:   Patient denies sore throat and sinus problems.  Hematologic/Lymphatic:   Patient denies swollen glands and easy bruising.  Cardiovascular:   Patient denies leg swelling and chest pains.  Respiratory:   Patient denies cough and shortness of breath.  Endocrine:   Patient denies excessive thirst.  Musculoskeletal:   Patient denies back pain and joint pain.  Neurological:   Patient denies dizziness and headaches.  Psychologic:   Patient denies depression and anxiety.   VITAL SIGNS: None   GU PHYSICAL EXAMINATION:    Testes: No tenderness, no swelling, no enlargement left testes. No tenderness, no swelling, no enlargement right testes. Normal location left testes. Normal location right testes. No mass, no cyst, no varicocele, no hydrocele left testes. No mass, no cyst, no varicocele, no hydrocele right testes.  Penis: Circumcised. Has a coronal hypospadias with meatal stenosis. He has some scarring around the corona of the penis from his circumcision.  Prostate: Prostate about 30 grams. Left lobe normal consistency, right lobe normal consistency. Symmetrical lobes. No prostate nodule. Left lobe no tenderness, right lobe no tenderness.    MULTI-SYSTEM PHYSICAL EXAMINATION:       Complexity of Data:  Source Of History:  Patient  Records Review:   AUA Symptom Score, Previous Patient Records  Urine Test Review:   Urinalysis   06/15/19  PSA  Total PSA 0.57 ng/mL    PROCEDURES:         PVR Ultrasound - 32440  Scanned Volume: 0 cc         Urinalysis Dipstick Dipstick  Cont'd  Color: Yellow Bilirubin: Neg mg/dL  Appearance: Clear Ketones: Neg mg/dL  Specific Gravity: 1.025 Blood: Neg ery/uL  pH: 6.0 Protein: Neg mg/dL  Glucose: 2+ mg/dL Urobilinogen: 0.2 mg/dL    Nitrites: Neg    Leukocyte Esterase: Neg leu/uL    ASSESSMENT:      ICD-10 Details  2 GU:   ED due to arterial insufficiency - N52.01 Undiagnosed New Problem  3   Encounter for Prostate Cancer screening - Z12.5 Chronic, Stable  1 NON-GU:   Other urethral stricture, male, meatal - N35.811 Chronic, Worsening   PLAN:            Medications New Meds: Tadalafil 5 mg tablet 1 tablet PO Daily   #30  0 Refill(s)            Document Letter(s):  Created for Patient: Clinical Summary         Notes:   Trial of Cialis. This has worked well for him in the past.   he would like to proceed with optilume urethral dilation. Risks and benefits discussed   Cc: Dr. Lajuana Ripple  Signed by Link Snuffer, III, M.D. on 07/26/20 at 3:23 PM (EDT

## 2020-08-21 NOTE — Anesthesia Postprocedure Evaluation (Signed)
Anesthesia Post Note  Patient: Alejandro Rivera  Procedure(s) Performed: CYSTOSCOPY WITH OPTILUME URETHRAL DILATATION (Bladder)     Patient location during evaluation: PACU Anesthesia Type: General Level of consciousness: awake and alert Pain management: pain level controlled Vital Signs Assessment: post-procedure vital signs reviewed and stable Respiratory status: spontaneous breathing, nonlabored ventilation, respiratory function stable and patient connected to nasal cannula oxygen Cardiovascular status: blood pressure returned to baseline and stable Postop Assessment: no apparent nausea or vomiting Anesthetic complications: no   No notable events documented.  Last Vitals:  Vitals:   08/21/20 1445 08/21/20 1502  BP: (!) 125/91 (!) 145/95  Pulse: 72 69  Resp: 14 12  Temp:  36.6 C  SpO2: 97% 99%    Last Pain:  Vitals:   08/21/20 1502  TempSrc:   PainSc: 5                  Barnet Glasgow

## 2020-08-21 NOTE — Interval H&P Note (Signed)
History and Physical Interval Note:  08/21/2020 1:18 PM  Alejandro Rivera  has presented today for surgery, with the diagnosis of MEATAL STENOSIS.  The various methods of treatment have been discussed with the patient and family. After consideration of risks, benefits and other options for treatment, the patient has consented to  Procedure(s): CYSTOSCOPY WITH OPTILUME URETHRAL DILATATION (N/A) as a surgical intervention.  The patient's history has been reviewed, patient examined, no change in status, stable for surgery.  I have reviewed the patient's chart and labs.  Questions were answered to the patient's satisfaction.     Marton Redwood, III

## 2020-08-21 NOTE — Op Note (Signed)
Operative Note  Preoperative diagnosis:  1.  Meatal stenosis  Postoperative diagnosis: 1.  Meatal stenosis  Procedure(s): 1.  Cystoscopy with optilume balloon urethral dilation  Surgeon: Link Snuffer, MD  Assistants: None  Anesthesia: General  Complications: None immediate  EBL: Minimal  Specimens: 1.  None  Drains/Catheters: 1.  18 French Foley catheter  Intraoperative findings: Pinpoint meatal stenosis.  Looks like a coronal hypospadias.  Evidence of chronic balanitis/lichen sclerosis  Indication: 55 year old male with meatal stenosis presents for the previously mentioned operation  Description of procedure:  The patient was identified and consent was obtained.  The patient was taken to the operating room and placed in the supine position.  The patient was placed under general anesthesia.  Perioperative antibiotics were administered.  The patient was placed in dorsal lithotomy.  Patient was prepped and draped in a standard sterile fashion and a timeout was performed.  A wire was advanced into the urethra and into the bladder.  Semirigid ureteroscope was advanced alongside the meatal stenosis.  I inspected the entire urethra and there was no stricture other than the meatal stenosis.  There was mild bilobar hypertrophy.  Bladder mucosa was inspected with the ureteroscope.  No tumors were obvious.  However this was with the ureteroscope.  I withdrew the scope visualizing the urethra again.  I then advanced the 24 [place] optilume balloon dilator over the wire and positioned it at the meatus.  I inflated it to a pressure of 12 where it remained dilated for 5 minutes.  It was deflated and the balloon and wire were withdrawn.  An 52 French Foley catheter was placed.  This concluded the operation.  Patient tolerated the procedure well and was stable postoperative.  Plan: Return in about 5 days for catheter removal

## 2020-08-21 NOTE — Transfer of Care (Signed)
Immediate Anesthesia Transfer of Care Note  Patient: Alejandro Rivera  Procedure(s) Performed: CYSTOSCOPY WITH OPTILUME URETHRAL DILATATION (Bladder)  Patient Location: PACU  Anesthesia Type:General  Level of Consciousness: awake, alert  and oriented  Airway & Oxygen Therapy: Patient Spontanous Breathing and Patient connected to face mask oxygen  Post-op Assessment: Report given to RN and Post -op Vital signs reviewed and stable  Post vital signs: Reviewed and stable  Last Vitals:  Vitals Value Taken Time  BP 137/102 08/21/20 1407  Temp    Pulse 74 08/21/20 1408  Resp 11 08/21/20 1408  SpO2 100 % 08/21/20 1408  Vitals shown include unvalidated device data.  Last Pain:  Vitals:   08/21/20 1017  TempSrc: Oral         Complications: No notable events documented.

## 2020-08-21 NOTE — Discharge Instructions (Addendum)
Wear a condom for 30 days if you engage in intercourse Patients with partners in childbearing years, use an effective contraceptive for 6 months.  Per Dr. Gloriann Loan - Return in 5 days for Foley removal in office.   Post Anesthesia Home Care Instructions  Activity: Get plenty of rest for the remainder of the day. A responsible individual must stay with you for 24 hours following the procedure.  For the next 24 hours, DO NOT: -Drive a car -Paediatric nurse -Drink alcoholic beverages -Take any medication unless instructed by your physician -Make any legal decisions or sign important papers.  Meals: Start with liquid foods such as gelatin or soup. Progress to regular foods as tolerated. Avoid greasy, spicy, heavy foods. If nausea and/or vomiting occur, drink only clear liquids until the nausea and/or vomiting subsides. Call your physician if vomiting continues.  Special Instructions/Symptoms: Your throat may feel dry or sore from the anesthesia or the breathing tube placed in your throat during surgery. If this causes discomfort, gargle with warm salt water. The discomfort should disappear within 24 hours.  If you had a scopolamine patch placed behind your ear for the management of post- operative nausea and/or vomiting:  1. The medication in the patch is effective for 72 hours, after which it should be removed.  Wrap patch in a tissue and discard in the trash. Wash hands thoroughly with soap and water. 2. You may remove the patch earlier than 72 hours if you experience unpleasant side effects which may include dry mouth, dizziness or visual disturbances. 3. Avoid touching the patch. Wash your hands with soap and water after contact with the patch.  CYSTOSCOPY HOME CARE INSTRUCTIONS  Activity: Rest for the remainder of the day.  Do not drive or operate equipment today.  You may resume normal activities in one to two days as instructed by your physician.   Meals: Drink plenty of liquids and  eat light foods such as gelatin or soup this evening.  You may return to a normal meal plan tomorrow.  Return to Work: You may return to work in one to two days or as instructed by your physician.  Special Instructions / Symptoms: Call your physician if any of these symptoms occur:   -persistent or heavy bleeding  -bleeding which continues after first few urination  -large blood clots that are difficult to pass  -urine stream diminishes or stops completely  -fever equal to or higher than 101 degrees Farenheit.  -cloudy urine with a strong, foul odor  -severe pain  Females should always wipe from front to back after elimination.  You may feel some burning pain when you urinate.  This should disappear with time.  Applying moist heat to the lower abdomen or a hot tub bath may help relieve the pain. \

## 2020-08-22 ENCOUNTER — Encounter (HOSPITAL_BASED_OUTPATIENT_CLINIC_OR_DEPARTMENT_OTHER): Payer: Self-pay | Admitting: Urology

## 2020-08-25 DIAGNOSIS — N35811 Other urethral stricture, male, meatal: Secondary | ICD-10-CM | POA: Diagnosis not present

## 2020-08-26 ENCOUNTER — Other Ambulatory Visit: Payer: Self-pay | Admitting: Family Medicine

## 2020-08-26 DIAGNOSIS — F411 Generalized anxiety disorder: Secondary | ICD-10-CM

## 2020-09-25 ENCOUNTER — Other Ambulatory Visit: Payer: Self-pay

## 2020-09-25 ENCOUNTER — Ambulatory Visit: Payer: BC Managed Care – PPO | Admitting: Family Medicine

## 2020-09-25 ENCOUNTER — Encounter: Payer: Self-pay | Admitting: Family Medicine

## 2020-09-25 VITALS — BP 98/68 | HR 60 | Ht 70.0 in | Wt 211.0 lb

## 2020-09-25 DIAGNOSIS — Z23 Encounter for immunization: Secondary | ICD-10-CM | POA: Diagnosis not present

## 2020-09-25 DIAGNOSIS — E1169 Type 2 diabetes mellitus with other specified complication: Secondary | ICD-10-CM

## 2020-09-25 DIAGNOSIS — F411 Generalized anxiety disorder: Secondary | ICD-10-CM | POA: Diagnosis not present

## 2020-09-25 DIAGNOSIS — I10 Essential (primary) hypertension: Secondary | ICD-10-CM | POA: Diagnosis not present

## 2020-09-25 DIAGNOSIS — E785 Hyperlipidemia, unspecified: Secondary | ICD-10-CM

## 2020-09-25 LAB — BAYER DCA HB A1C WAIVED: HB A1C (BAYER DCA - WAIVED): 6.7 % (ref <7.0)

## 2020-09-25 MED ORDER — PAROXETINE HCL 30 MG PO TABS: 30.0000 mg | ORAL_TABLET | Freq: Every day | ORAL | 3 refills | Status: DC

## 2020-09-25 MED ORDER — METFORMIN HCL 500 MG PO TABS: 1000.0000 mg | ORAL_TABLET | Freq: Two times a day (BID) | ORAL | 3 refills | Status: DC

## 2020-09-25 NOTE — Addendum Note (Signed)
Addended by: Alphonzo Dublin on: 09/25/2020 09:06 AM   Modules accepted: Orders

## 2020-09-25 NOTE — Progress Notes (Signed)
BP 98/68   Pulse 60   Ht '5\' 10"'  (1.778 m)   Wt 211 lb (95.7 kg)   SpO2 98%   BMI 30.28 kg/m    Subjective:   Patient ID: Alejandro Rivera, male    DOB: 1965/07/23, 55 y.o.   MRN: [id number]  HPI: Alejandro Rivera is a 55 y.o. male presenting on 09/25/2020 for Medical Management of Chronic Issues, Hypertension, Diabetes, and Gastroesophageal Reflux   HPI Type 2 diabetes mellitus Patient comes in today for recheck of his diabetes. Patient has been currently taking [place] and metformin, A1c looks good at 6.7 today. Patient is currently on an ACE inhibitor/ARB. Patient has not seen an ophthalmologist this year. Patient denies any issues with their feet. The symptom started onset as an adult hyperlipidemia and hypertension ARE RELATED TO DM   Hyperlipidemia Patient is coming in for recheck of his hyperlipidemia. The patient is currently taking fish oil and pravastatin. They deny any issues with myalgias or history of liver damage from it. They deny any focal numbness or weakness or chest pain.   Hypertension Patient is currently on lisinopril hydrochlorothiazide, and their blood pressure today is 98/68, he says that he had some stomach issues over the weekend and was feeling better but was not eating or drinking much over the weekend Likely why his blood pressure is down.. Patient denies any lightheadedness or dizziness. Patient denies headaches, blurred vision, chest pains, shortness of breath, or weakness. Denies any side effects from medication and is content with current medication.   Anxiety recheck Patient is currently taking Paxil for anxiety.  Feels like he has been a little more irritable and anxious recently wants to adjust his medicines, he feels like it is not working as well as it used to.  Patient denies any suicidal ideations or thoughts of hurting self or major anxiety depression  Relevant past medical, surgical, family and social history reviewed and updated as  indicated. Interim medical history since our last visit reviewed. Allergies and medications reviewed and updated.  Review of Systems  Constitutional:  Negative for chills and fever.  Eyes:  Negative for visual disturbance.  Respiratory:  Negative for shortness of breath and wheezing.   Cardiovascular:  Negative for chest pain and leg swelling.  Musculoskeletal:  Negative for back pain and gait problem.  Skin:  Negative for rash.  All other systems reviewed and are negative.  Per HPI unless specifically indicated above   Allergies as of 09/25/2020   No Known Allergies      Medication List        Accurate as of September 25, 2020  8:56 AM. If you have any questions, ask your nurse or doctor.          allopurinol 300 MG tablet Commonly known as: ZYLOPRIM Take 1 tablet (300 mg total) by mouth daily.   dapagliflozin propanediol 10 MG Tabs tablet Commonly known as: FARXIGA Take 10 mg by mouth daily.   Fish Oil 1000 MG Caps Take by mouth. 2 in am 2 in pm   HYDROcodone-acetaminophen 5-325 MG tablet Commonly known as: NORCO/VICODIN Take 1 tablet by mouth every 4 (four) hours as needed for moderate pain.   IBUPROFEN PO Take 200 mg by mouth daily as needed.   lisinopril-hydrochlorothiazide 20-25 MG tablet Commonly known as: ZESTORETIC Take 1 tablet by mouth daily.   metFORMIN 500 MG tablet Commonly known as: GLUCOPHAGE Take 2 tablets (1,000 mg total) by mouth 2 (two) times daily  with a meal.   multivitamin tablet Take 1 tablet by mouth daily.   omeprazole 40 MG capsule Commonly known as: PRILOSEC Take 1 capsule by mouth once daily   PARoxetine 30 MG tablet Commonly known as: PAXIL Take 1 tablet (30 mg total) by mouth daily. What changed:  medication strength how much to take Changed by: Fransisca Kaufmann Christos Mixson, MD   pravastatin 20 MG tablet Commonly known as: PRAVACHOL Take 1 tablet (20 mg total) by mouth daily.   sildenafil 20 MG tablet Commonly known as:  REVATIO Take 1-3 tablets (20-60 mg total) by mouth as needed.         Objective:   BP 98/68   Pulse 60   Ht '5\' 10"'  (1.778 m)   Wt 211 lb (95.7 kg)   SpO2 98%   BMI 30.28 kg/m   Wt Readings from Last 3 Encounters:  09/25/20 211 lb (95.7 kg)  08/21/20 209 lb 14.4 oz (95.2 kg)  06/22/20 210 lb (95.3 kg)    Physical Exam Vitals and nursing note reviewed.  Constitutional:      General: He is not in acute distress.    Appearance: He is well-developed. He is not diaphoretic.  Eyes:     General: No scleral icterus.    Conjunctiva/sclera: Conjunctivae normal.  Neck:     Thyroid: No thyromegaly.  Cardiovascular:     Rate and Rhythm: Normal rate and regular rhythm.     Heart sounds: Normal heart sounds. No murmur heard. Pulmonary:     Effort: Pulmonary effort is normal. No respiratory distress.     Breath sounds: Normal breath sounds. No wheezing.  Musculoskeletal:        General: No swelling. Normal range of motion.     Cervical back: Neck supple.  Lymphadenopathy:     Cervical: No cervical adenopathy.  Skin:    General: Skin is warm and dry.     Findings: No rash.  Neurological:     Mental Status: He is alert and oriented to person, place, and time.     Coordination: Coordination normal.  Psychiatric:        Behavior: Behavior normal.      Assessment & Plan:   Problem List Items Addressed This Visit       Cardiovascular and Mediastinum   Essential hypertension   Relevant Orders   CBC with Differential/Platelet   CMP14+EGFR   Lipid panel   Bayer DCA Hb A1c Waived     Endocrine   Hyperlipidemia associated with type 2 diabetes mellitus ([place])   Relevant Medications   metFORMIN (GLUCOPHAGE) 500 MG tablet   Other Relevant Orders   CBC with Differential/Platelet   CMP14+EGFR   Lipid panel   Bayer DCA Hb A1c Waived   Type 2 diabetes mellitus with other specified complication (HCC) - Primary   Relevant Medications   metFORMIN (GLUCOPHAGE) 500 MG tablet    Other Relevant Orders   CBC with Differential/Platelet   CMP14+EGFR   Lipid panel   Bayer DCA Hb A1c Waived     Other   GAD (generalized anxiety disorder)   Relevant Medications   PARoxetine (PAXIL) 30 MG tablet  Increase Paxil to 30 mg for the patient.  Continue other medication, A1c looks good at 6.7.  He will monitor the blood pressure, it is lower but he has been dealing with a stomach issue over the weekend that is improved, recommended hydration.  Follow up plan: Return in about 3 months (around 12/26/2020),  or if symptoms worsen or fail to improve, for Diabetes and hypertension and cholesterol recheck.  Counseling provided for all of the vaccine components Orders Placed This Encounter  Procedures   CBC with Differential/Platelet   CMP14+EGFR   Lipid panel   Bayer DCA Hb A1c Waived    Caryl Pina, MD [facility] 09/25/2020, 8:56 AM

## 2020-09-26 LAB — LIPID PANEL
Chol/HDL Ratio: 3.3 ratio (ref 0.0–5.0)
Cholesterol, Total: 170 mg/dL (ref 100–199)
HDL: 52 mg/dL (ref >39)
LDL Chol Calc (NIH): 83 mg/dL (ref 0–99)
Triglycerides: 212 mg/dL — ABNORMAL HIGH (ref 0–149)
VLDL Cholesterol Cal: 35 mg/dL (ref 5–40)

## 2020-09-26 LAB — CBC WITH DIFFERENTIAL/PLATELET
Basophils Absolute: 0.1 10*3/uL (ref 0.0–0.2)
Basos: 1 %
EOS (ABSOLUTE): 0.2 10*3/uL (ref 0.0–0.4)
Eos: 2 %
Hematocrit: 50.8 % (ref 37.5–51.0)
Hemoglobin: 16.9 g/dL (ref 13.0–17.7)
Immature Grans (Abs): 0 10*3/uL (ref 0.0–0.1)
Immature Granulocytes: 0 %
Lymphocytes Absolute: 2.2 10*3/uL (ref 0.7–3.1)
Lymphs: 22 %
MCH: 30.7 pg (ref 26.6–33.0)
MCHC: 33.3 g/dL (ref 31.5–35.7)
MCV: 92 fL (ref 79–97)
Monocytes Absolute: 1 10*3/uL — ABNORMAL HIGH (ref 0.1–0.9)
Monocytes: 10 %
Neutrophils Absolute: 6.4 10*3/uL (ref 1.4–7.0)
Neutrophils: 65 %
Platelets: 245 10*3/uL (ref 150–450)
RBC: 5.5 x10E6/uL (ref 4.14–5.80)
RDW: 12.6 % (ref 11.6–15.4)
WBC: 9.8 10*3/uL (ref 3.4–10.8)

## 2020-09-26 LAB — CMP14+EGFR
ALT: 18 IU/L (ref 0–44)
AST: 13 IU/L (ref 0–40)
Albumin/Globulin Ratio: 1.7 (ref 1.2–2.2)
Albumin: 4.7 g/dL (ref 3.8–4.9)
Alkaline Phosphatase: 98 IU/L (ref 44–121)
BUN/Creatinine Ratio: 12 (ref 9–20)
BUN: 20 mg/dL (ref 6–24)
Bilirubin Total: 0.6 mg/dL (ref 0.0–1.2)
CO2: 20 mmol/L (ref 20–29)
Calcium: 10.2 mg/dL (ref 8.7–10.2)
Chloride: 99 mmol/L (ref 96–106)
Creatinine, Ser: 1.61 mg/dL — ABNORMAL HIGH (ref 0.76–1.27)
Globulin, Total: 2.8 g/dL (ref 1.5–4.5)
Glucose: 168 mg/dL — ABNORMAL HIGH (ref 65–99)
Potassium: 4.3 mmol/L (ref 3.5–5.2)
Sodium: 138 mmol/L (ref 134–144)
Total Protein: 7.5 g/dL (ref 6.0–8.5)
eGFR: 50 mL/min/{1.73_m2} — ABNORMAL LOW (ref >59)

## 2020-09-28 NOTE — Progress Notes (Signed)
R/c about labs

## 2020-12-27 ENCOUNTER — Other Ambulatory Visit: Payer: Self-pay

## 2020-12-27 ENCOUNTER — Ambulatory Visit: Payer: BC Managed Care – PPO | Admitting: Family Medicine

## 2020-12-27 ENCOUNTER — Encounter: Payer: Self-pay | Admitting: Family Medicine

## 2020-12-27 VITALS — BP 110/75 | HR 73 | Ht 70.0 in | Wt 213.0 lb

## 2020-12-27 DIAGNOSIS — I10 Essential (primary) hypertension: Secondary | ICD-10-CM

## 2020-12-27 DIAGNOSIS — F411 Generalized anxiety disorder: Secondary | ICD-10-CM

## 2020-12-27 DIAGNOSIS — E785 Hyperlipidemia, unspecified: Secondary | ICD-10-CM

## 2020-12-27 DIAGNOSIS — M1A9XX0 Chronic gout, unspecified, without tophus (tophi): Secondary | ICD-10-CM

## 2020-12-27 DIAGNOSIS — E1169 Type 2 diabetes mellitus with other specified complication: Secondary | ICD-10-CM

## 2020-12-27 DIAGNOSIS — M109 Gout, unspecified: Secondary | ICD-10-CM | POA: Insufficient documentation

## 2020-12-27 LAB — CBC WITH DIFFERENTIAL/PLATELET
Basophils Absolute: 0.1 10*3/uL (ref 0.0–0.2)
Basos: 1 %
EOS (ABSOLUTE): 0.2 10*3/uL (ref 0.0–0.4)
Eos: 3 %
Hematocrit: 45.8 % (ref 37.5–51.0)
Hemoglobin: 15.8 g/dL (ref 13.0–17.7)
Immature Grans (Abs): 0 10*3/uL (ref 0.0–0.1)
Immature Granulocytes: 0 %
Lymphocytes Absolute: 1.8 10*3/uL (ref 0.7–3.1)
Lymphs: 26 %
MCH: 31.4 pg (ref 26.6–33.0)
MCHC: 34.5 g/dL (ref 31.5–35.7)
MCV: 91 fL (ref 79–97)
Monocytes Absolute: 0.7 10*3/uL (ref 0.1–0.9)
Monocytes: 10 %
Neutrophils Absolute: 4.1 10*3/uL (ref 1.4–7.0)
Neutrophils: 60 %
Platelets: 278 10*3/uL (ref 150–450)
RBC: 5.03 x10E6/uL (ref 4.14–5.80)
RDW: 12.5 % (ref 11.6–15.4)
WBC: 6.7 10*3/uL (ref 3.4–10.8)

## 2020-12-27 LAB — CMP14+EGFR
ALT: 26 IU/L (ref 0–44)
AST: 27 IU/L (ref 0–40)
Albumin/Globulin Ratio: 1.6 (ref 1.2–2.2)
Albumin: 4.3 g/dL (ref 3.8–4.9)
Alkaline Phosphatase: 81 IU/L (ref 44–121)
BUN/Creatinine Ratio: 13 (ref 9–20)
BUN: 13 mg/dL (ref 6–24)
Bilirubin Total: 0.4 mg/dL (ref 0.0–1.2)
CO2: 24 mmol/L (ref 20–29)
Calcium: 9.7 mg/dL (ref 8.7–10.2)
Chloride: 99 mmol/L (ref 96–106)
Creatinine, Ser: 1.03 mg/dL (ref 0.76–1.27)
Globulin, Total: 2.7 g/dL (ref 1.5–4.5)
Glucose: 153 mg/dL — ABNORMAL HIGH (ref 70–99)
Potassium: 4.5 mmol/L (ref 3.5–5.2)
Sodium: 136 mmol/L (ref 134–144)
Total Protein: 7 g/dL (ref 6.0–8.5)
eGFR: 85 mL/min/{1.73_m2} (ref >59)

## 2020-12-27 LAB — LIPID PANEL
Chol/HDL Ratio: 3.2 ratio (ref 0.0–5.0)
Cholesterol, Total: 140 mg/dL (ref 100–199)
HDL: 44 mg/dL (ref >39)
LDL Chol Calc (NIH): 63 mg/dL (ref 0–99)
Triglycerides: 201 mg/dL — ABNORMAL HIGH (ref 0–149)
VLDL Cholesterol Cal: 33 mg/dL (ref 5–40)

## 2020-12-27 LAB — BAYER DCA HB A1C WAIVED: HB A1C (BAYER DCA - WAIVED): 7 % — ABNORMAL HIGH (ref 4.8–5.6)

## 2020-12-27 MED ORDER — ALLOPURINOL 300 MG PO TABS: 300.0000 mg | ORAL_TABLET | Freq: Every day | ORAL | 3 refills | Status: DC

## 2020-12-27 NOTE — Progress Notes (Signed)
BP 110/75   Pulse 73   Ht _0  (1.778 m)   Wt 213 lb (96.6 kg)   SpO2 95%   BMI 30.56 kg/m    Subjective:   Patient ID: Alejandro Rivera, male    DOB: 02/26/1965, 55 y.o.   MRN: [id number]  HPI: Alejandro Rivera is a 55 y.o. male presenting on 12/27/2020 for Medical Management of Chronic Issues, Diabetes, and Hypertension   HPI Hypertension Patient is currently on lisinopril hydrochlorothiazide, and their blood pressure today is 110/75. Patient denies any lightheadedness or dizziness. Patient denies headaches, blurred vision, chest pains, shortness of breath, or weakness. Denies any side effects from medication and is content with current medication.   Hyperlipidemia Patient is coming in for recheck of his hyperlipidemia. The patient is currently taking fish oils and pravastatin. They deny any issues with myalgias or history of liver damage from it. They deny any focal numbness or weakness or chest pain.   Type 2 diabetes mellitus Patient comes in today for recheck of his diabetes. Patient has been currently taking [place] and metformin. Patient is currently on an ACE inhibitor/ARB. Patient has seen an ophthalmologist this year. Patient denies any issues with their feet. The symptom started onset as an adult hypertension and hyperlipidemia ARE RELATED TO DM   Gout Last attack: More than a year ago Attacks this year: None Medication: Allopurinol Location of attacks: Right foot and toe  Anxiety recheck Patient currently takes Paxil for anxiety and feels like is doing well for him.  Denies any side effects or suicidal ideations.  Has been on for quite some time. Depression screen Alejandro Rivera 2/9 12/27/2020 09/25/2020 06/22/2020 03/20/2020 12/21/2019  Decreased Interest 0 0 0 0 0  Down, Depressed, Hopeless 0 0 0 0 0  PHQ - 2 Score 0 0 0 0 0  Altered sleeping - - - - -  Tired, decreased energy - - - - -  Change in appetite - - - - -  Feeling bad or failure about yourself  - - - - -   Trouble concentrating - - - - -  Moving slowly or fidgety/restless - - - - -  Suicidal thoughts - - - - -  PHQ-9 Score - - - - -  Difficult doing work/chores - - - - -     Relevant past medical, surgical, family and social history reviewed and updated as indicated. Interim medical history since our last visit reviewed. Allergies and medications reviewed and updated.  Review of Systems  Constitutional:  Negative for chills and fever.  Eyes:  Negative for visual disturbance.  Respiratory:  Negative for shortness of breath and wheezing.   Cardiovascular:  Negative for chest pain and leg swelling.  Skin:  Negative for rash.  Neurological:  Negative for dizziness, weakness and light-headedness.  All other systems reviewed and are negative.  Per HPI unless specifically indicated above   Allergies as of 12/27/2020   No Known Allergies      Medication List        Accurate as of December 27, 2020  9:43 AM. If you have any questions, ask your nurse or doctor.          allopurinol 300 MG tablet Commonly known as: ZYLOPRIM Take 1 tablet (300 mg total) by mouth daily.   dapagliflozin propanediol 10 MG Tabs tablet Commonly known as: FARXIGA Take 10 mg by mouth daily.   Fish Oil 1000 MG Caps Take by mouth. 2 in  am 2 in pm   HYDROcodone-acetaminophen 5-325 MG tablet Commonly known as: NORCO/VICODIN Take 1 tablet by mouth every 4 (four) hours as needed for moderate pain.   IBUPROFEN PO Take 200 mg by mouth daily as needed.   lisinopril-hydrochlorothiazide 20-25 MG tablet Commonly known as: ZESTORETIC Take 1 tablet by mouth daily.   metFORMIN 500 MG tablet Commonly known as: GLUCOPHAGE Take 2 tablets (1,000 mg total) by mouth 2 (two) times daily with a meal.   multivitamin tablet Take 1 tablet by mouth daily.   omeprazole 40 MG capsule Commonly known as: PRILOSEC Take 1 capsule by mouth once daily   PARoxetine 30 MG tablet Commonly known as: PAXIL Take 1 tablet  (30 mg total) by mouth daily.   pravastatin 20 MG tablet Commonly known as: PRAVACHOL Take 1 tablet (20 mg total) by mouth daily.   sildenafil 20 MG tablet Commonly known as: REVATIO Take 1-3 tablets (20-60 mg total) by mouth as needed.         Objective:   BP 110/75   Pulse 73   Ht _0  (1.778 m)   Wt 213 lb (96.6 kg)   SpO2 95%   BMI 30.56 kg/m   Wt Readings from Last 3 Encounters:  12/27/20 213 lb (96.6 kg)  09/25/20 211 lb (95.7 kg)  08/21/20 209 lb 14.4 oz (95.2 kg)    Physical Exam Vitals and nursing note reviewed.  Constitutional:      General: He is not in acute distress.    Appearance: He is well-developed. He is not diaphoretic.  Eyes:     General: No scleral icterus.    Conjunctiva/sclera: Conjunctivae normal.  Neck:     Thyroid: No thyromegaly.  Cardiovascular:     Rate and Rhythm: Normal rate and regular rhythm.     Heart sounds: Normal heart sounds. No murmur heard. Pulmonary:     Effort: Pulmonary effort is normal. No respiratory distress.     Breath sounds: Normal breath sounds. No wheezing.  Musculoskeletal:        General: No swelling. Normal range of motion.     Cervical back: Neck supple.  Lymphadenopathy:     Cervical: No cervical adenopathy.  Skin:    General: Skin is warm and dry.     Findings: No rash.  Neurological:     Mental Status: He is alert and oriented to person, place, and time.     Coordination: Coordination normal.  Psychiatric:        Behavior: Behavior normal.      Assessment & Plan:   Problem List Items Addressed This Visit       Cardiovascular and Mediastinum   Essential hypertension   Relevant Orders   CBC with Differential/Platelet   CMP14+EGFR   Lipid panel   Bayer DCA Hb A1c Waived     Endocrine   Hyperlipidemia associated with type 2 diabetes mellitus ([place])   Relevant Orders   CBC with Differential/Platelet   CMP14+EGFR   Lipid panel   Bayer DCA Hb A1c Waived   Type 2 diabetes mellitus with  other specified complication ([place]) - Primary   Relevant Orders   CBC with Differential/Platelet   CMP14+EGFR   Lipid panel   Bayer DCA Hb A1c Waived     Other   Gout   GAD (generalized anxiety disorder)    Continue current medicine, no changes Follow up plan: Return in about 3 months (around 03/29/2021), or if symptoms worsen or fail to  improve, for Diabetes and hypertension and cholesterol recheck.  Counseling provided for all of the vaccine components Orders Placed This Encounter  Procedures   CBC with Differential/Platelet   CMP14+EGFR   Lipid panel   Bayer DCA Hb A1c Waived    Caryl Pina, MD [facility] 12/27/2020, 9:43 AM

## 2021-01-10 ENCOUNTER — Other Ambulatory Visit: Payer: Self-pay

## 2021-01-10 ENCOUNTER — Telehealth: Payer: Self-pay | Admitting: Family Medicine

## 2021-01-10 MED ORDER — DAPAGLIFLOZIN PROPANEDIOL 10 MG PO TABS: 10.0000 mg | ORAL_TABLET | Freq: Every day | ORAL | 3 refills | Status: DC

## 2021-01-10 NOTE — Telephone Encounter (Signed)
Does not look like Dr. Warrick Parisian was the original prescriber of Wilder Glade. Based off of last OV note Dettinger did want pt to take 10mg  qd of Farxiga. 90 day supply with 3 refills sent to [facility]. Informed pt of this and advised him to contact [facility] if needed. Also informed pt that there is not a current PA for him in the office.  Pt states that he brought insurance paperwork to his last visit that informed pt of a PA being needed for [place]. Dettinger has signed forms and sent up front to scan. I did not see the paperwork. Will see if [facility] sends over a PA.

## 2021-02-24 ENCOUNTER — Other Ambulatory Visit: Payer: Self-pay | Admitting: Family Medicine

## 2021-02-24 DIAGNOSIS — E785 Hyperlipidemia, unspecified: Secondary | ICD-10-CM

## 2021-02-24 DIAGNOSIS — K219 Gastro-esophageal reflux disease without esophagitis: Secondary | ICD-10-CM

## 2021-02-24 DIAGNOSIS — E1169 Type 2 diabetes mellitus with other specified complication: Secondary | ICD-10-CM

## 2021-02-24 DIAGNOSIS — I10 Essential (primary) hypertension: Secondary | ICD-10-CM

## 2021-04-04 ENCOUNTER — Ambulatory Visit: Payer: BC Managed Care – PPO | Admitting: Family Medicine

## 2021-04-04 ENCOUNTER — Encounter: Payer: Self-pay | Admitting: Family Medicine

## 2021-04-04 VITALS — BP 121/83 | HR 72 | Ht 70.0 in | Wt 217.0 lb

## 2021-04-04 DIAGNOSIS — F411 Generalized anxiety disorder: Secondary | ICD-10-CM

## 2021-04-04 DIAGNOSIS — E785 Hyperlipidemia, unspecified: Secondary | ICD-10-CM

## 2021-04-04 DIAGNOSIS — E1169 Type 2 diabetes mellitus with other specified complication: Secondary | ICD-10-CM

## 2021-04-04 DIAGNOSIS — K219 Gastro-esophageal reflux disease without esophagitis: Secondary | ICD-10-CM

## 2021-04-04 DIAGNOSIS — I10 Essential (primary) hypertension: Secondary | ICD-10-CM | POA: Diagnosis not present

## 2021-04-04 LAB — BAYER DCA HB A1C WAIVED: HB A1C (BAYER DCA - WAIVED): 7.5 % — ABNORMAL HIGH (ref 4.8–5.6)

## 2021-04-04 MED ORDER — PRAVASTATIN SODIUM 20 MG PO TABS: 20.0000 mg | ORAL_TABLET | Freq: Every day | ORAL | 3 refills | Status: DC

## 2021-04-04 MED ORDER — OMEPRAZOLE 40 MG PO CPDR: 40.0000 mg | DELAYED_RELEASE_CAPSULE | Freq: Every day | ORAL | 3 refills | Status: DC

## 2021-04-04 MED ORDER — LISINOPRIL-HYDROCHLOROTHIAZIDE 20-25 MG PO TABS: 1.0000 | ORAL_TABLET | Freq: Every day | ORAL | 3 refills | Status: DC

## 2021-04-04 NOTE — Progress Notes (Signed)
BP 121/83    Pulse 72    Ht 5\' 10"  (1.778 m)    Wt 217 lb (98.4 kg)    SpO2 97%    BMI 31.14 kg/m    Subjective:   Patient ID: Alejandro Rivera, male    DOB: Feb 16, 1965, 56 y.o.   MRN: [id number]  HPI: Alejandro Rivera is a 56 y.o. male presenting on 04/04/2021 for Medical Management of Chronic Issues, Hypertension, and Diabetes   HPI Type 2 diabetes mellitus Patient comes in today for recheck of his diabetes. Patient has been currently taking metformin and [place]. Patient is currently on an ACE inhibitor/ARB. Patient has not seen an ophthalmologist this year. Patient denies any issues with their feet. The symptom started onset as an adult hypertension and hyperlipidemia ARE RELATED TO DM   Hypertension Patient is currently on lisinopril hydrochlorothiazide, and their blood pressure today is 121/83. Patient denies any lightheadedness or dizziness. Patient denies headaches, blurred vision, chest pains, shortness of breath, or weakness. Denies any side effects from medication and is content with current medication.   Hyperlipidemia Patient is coming in for recheck of his hyperlipidemia. The patient is currently taking pravastatin. They deny any issues with myalgias or history of liver damage from it. They deny any focal numbness or weakness or chest pain.   GERD Patient is currently on omeprazole.  She denies any major symptoms or abdominal pain or belching or burping. She denies any blood in her stool or lightheadedness or dizziness.   Anxiety Patient comes in today for anxiety recheck.  He currently takes Paxil and feels like it is doing much better for him at 30 mg.  He is very satisfied with that.  He denies any suicidal ideations or thoughts of hurting himself.  Relevant past medical, surgical, family and social history reviewed and updated as indicated. Interim medical history since our last visit reviewed. Allergies and medications reviewed and updated.  Review of Systems   Constitutional:  Negative for chills and fever.  Eyes:  Negative for visual disturbance.  Respiratory:  Negative for shortness of breath and wheezing.   Cardiovascular:  Negative for chest pain and leg swelling.  Musculoskeletal:  Negative for back pain and gait problem.  Skin:  Negative for rash.  Neurological:  Negative for dizziness, weakness and light-headedness.  All other systems reviewed and are negative.  Per HPI unless specifically indicated above   Allergies as of 04/04/2021   No Known Allergies      Medication List        Accurate as of April 04, 2021  9:06 AM. If you have any questions, ask your nurse or doctor.          allopurinol 300 MG tablet Commonly known as: ZYLOPRIM Take 1 tablet (300 mg total) by mouth daily.   dapagliflozin propanediol 10 MG Tabs tablet Commonly known as: FARXIGA Take 1 tablet (10 mg total) by mouth daily.   Fish Oil 1000 MG Caps Take by mouth. 2 in am 2 in pm   HYDROcodone-acetaminophen 5-325 MG tablet Commonly known as: NORCO/VICODIN Take 1 tablet by mouth every 4 (four) hours as needed for moderate pain.   IBUPROFEN PO Take 200 mg by mouth daily as needed.   lisinopril-hydrochlorothiazide 20-25 MG tablet Commonly known as: ZESTORETIC Take 1 tablet by mouth daily.   metFORMIN 500 MG tablet Commonly known as: GLUCOPHAGE Take 2 tablets (1,000 mg total) by mouth 2 (two) times daily with a meal.  multivitamin tablet Take 1 tablet by mouth daily.   omeprazole 40 MG capsule Commonly known as: PRILOSEC Take 1 capsule (40 mg total) by mouth daily.   PARoxetine 30 MG tablet Commonly known as: PAXIL Take 1 tablet (30 mg total) by mouth daily.   pravastatin 20 MG tablet Commonly known as: PRAVACHOL Take 1 tablet (20 mg total) by mouth daily.   sildenafil 20 MG tablet Commonly known as: REVATIO Take 1-3 tablets (20-60 mg total) by mouth as needed.         Objective:   BP 121/83    Pulse 72    Ht 5\' 10"   (1.778 m)    Wt 217 lb (98.4 kg)    SpO2 97%    BMI 31.14 kg/m   Wt Readings from Last 3 Encounters:  04/04/21 217 lb (98.4 kg)  12/27/20 213 lb (96.6 kg)  09/25/20 211 lb (95.7 kg)    Physical Exam Vitals and nursing note reviewed.  Constitutional:      General: He is not in acute distress.    Appearance: He is well-developed. He is not diaphoretic.  Eyes:     General: No scleral icterus.    Conjunctiva/sclera: Conjunctivae normal.  Neck:     Thyroid: No thyromegaly.  Cardiovascular:     Rate and Rhythm: Normal rate and regular rhythm.     Heart sounds: Normal heart sounds. No murmur heard. Pulmonary:     Effort: Pulmonary effort is normal. No respiratory distress.     Breath sounds: Normal breath sounds. No wheezing.  Musculoskeletal:        General: Normal range of motion.     Cervical back: Neck supple.  Lymphadenopathy:     Cervical: No cervical adenopathy.  Skin:    General: Skin is warm and dry.     Findings: No rash.  Neurological:     Mental Status: He is alert and oriented to person, place, and time.     Coordination: Coordination normal.  Psychiatric:        Behavior: Behavior normal.      Assessment & Plan:   Problem List Items Addressed This Visit       Cardiovascular and Mediastinum   Essential hypertension   Relevant Medications   lisinopril-hydrochlorothiazide (ZESTORETIC) 20-25 MG tablet   pravastatin (PRAVACHOL) 20 MG tablet     Digestive   GERD (gastroesophageal reflux disease)   Relevant Medications   omeprazole (PRILOSEC) 40 MG capsule     Endocrine   Hyperlipidemia associated with type 2 diabetes mellitus (HCC)   Relevant Medications   lisinopril-hydrochlorothiazide (ZESTORETIC) 20-25 MG tablet   pravastatin (PRAVACHOL) 20 MG tablet   Other Relevant Orders   Bayer DCA Hb A1c Waived   Type 2 diabetes mellitus with other specified complication (HCC)   Relevant Medications   lisinopril-hydrochlorothiazide (ZESTORETIC) 20-25 MG tablet    pravastatin (PRAVACHOL) 20 MG tablet     Other   GAD (generalized anxiety disorder) - Primary  A1c up slightly at 7.5, recommended that he focus on diet and gave education..  Blood pressure looks good, no changes  Follow up plan: Return in about 3 months (around 07/02/2021), or if symptoms worsen or fail to improve, for Diabetes recheck.  Counseling provided for all of the vaccine components Orders Placed This Encounter  Procedures   Bayer [place] Hb A1c [place] Greenly Rarick, MD [facility] 04/04/2021, 9:06 AM

## 2021-04-10 LAB — HM DIABETES EYE EXAM

## 2021-07-11 ENCOUNTER — Ambulatory Visit: Payer: BC Managed Care – PPO | Admitting: Family Medicine

## 2021-08-13 ENCOUNTER — Encounter: Payer: Self-pay | Admitting: Family Medicine

## 2021-08-13 ENCOUNTER — Ambulatory Visit: Payer: BC Managed Care – PPO | Admitting: Family Medicine

## 2021-08-13 VITALS — BP 122/87 | HR 86 | Temp 98.3°F | Ht 70.0 in | Wt 219.0 lb

## 2021-08-13 DIAGNOSIS — I10 Essential (primary) hypertension: Secondary | ICD-10-CM

## 2021-08-13 DIAGNOSIS — E1169 Type 2 diabetes mellitus with other specified complication: Secondary | ICD-10-CM

## 2021-08-13 DIAGNOSIS — Z125 Encounter for screening for malignant neoplasm of prostate: Secondary | ICD-10-CM | POA: Diagnosis not present

## 2021-08-13 DIAGNOSIS — F411 Generalized anxiety disorder: Secondary | ICD-10-CM

## 2021-08-13 DIAGNOSIS — E785 Hyperlipidemia, unspecified: Secondary | ICD-10-CM

## 2021-08-13 LAB — BAYER DCA HB A1C WAIVED: HB A1C (BAYER DCA - WAIVED): 7.2 % — ABNORMAL HIGH (ref 4.8–5.6)

## 2021-08-13 MED ORDER — METFORMIN HCL 500 MG PO TABS: 1000.0000 mg | ORAL_TABLET | Freq: Two times a day (BID) | ORAL | 3 refills | Status: DC

## 2021-08-13 MED ORDER — PAROXETINE HCL 30 MG PO TABS: 30.0000 mg | ORAL_TABLET | Freq: Every day | ORAL | 3 refills | Status: DC

## 2021-08-13 MED ORDER — DAPAGLIFLOZIN PROPANEDIOL 10 MG PO TABS: 10.0000 mg | ORAL_TABLET | Freq: Every day | ORAL | 3 refills | Status: DC

## 2021-08-13 NOTE — Progress Notes (Signed)
BP 122/87   Pulse 86   Temp 98.3 F (36.8 C)   Ht '5\' 10"'  (1.778 m)   Wt 219 lb (99.3 kg)   SpO2 95%   BMI 31.42 kg/m    Subjective:   Patient ID: Alejandro Rivera, male    DOB: Oct 07, 1965, 56 y.o.   MRN: [id number]  HPI: Alejandro Rivera is a 56 y.o. male presenting on 08/13/2021 for Medical Management of Chronic Issues, Diabetes, Hyperlipidemia, Hypertension, and Anxiety   HPI Type 2 diabetes mellitus Patient comes in today for recheck of his diabetes. Patient has been currently taking metformin, has run out of the [place] and been off of it for a few months but has a new job so he can afford it again. Patient is currently on an ACE inhibitor/ARB. Patient has not seen an ophthalmologist this year. Patient denies any issues with their feet. The symptom started onset as an adult hyperlipidemia and hypertension ARE RELATED TO DM   Hyperlipidemia Patient is coming in for recheck of his hyperlipidemia. The patient is currently taking pravastatin. They deny any issues with myalgias or history of liver damage from it. They deny any focal numbness or weakness or chest pain.   Hypertension Patient is currently on lisinopril hydrochlorothiazide, and their blood pressure today is 122/87. Patient denies any lightheadedness or dizziness. Patient denies headaches, blurred vision, chest pains, shortness of breath, or weakness. Denies any side effects from medication and is content with current medication.   Anxiety recheck Patient currently takes Paxil for anxiety.  He says his anxiety is under control and he is doing well with it.    08/13/2021    8:27 AM 04/04/2021    8:19 AM 12/27/2020    9:36 AM 09/25/2020    8:09 AM 06/22/2020    8:15 AM  Depression screen PHQ 2/9  Decreased Interest 0 0 0 0 0  Down, Depressed, Hopeless 0 0 0 0 0  PHQ - 2 Score 0 0 0 0 0  Altered sleeping 0      Tired, decreased energy 0      Change in appetite 0      Feeling bad or failure about yourself  0       Trouble concentrating 0      Moving slowly or fidgety/restless 0      Suicidal thoughts 0      PHQ-9 Score 0      Difficult doing work/chores Not difficult at all         Relevant past medical, surgical, family and social history reviewed and updated as indicated. Interim medical history since our last visit reviewed. Allergies and medications reviewed and updated.  Review of Systems  Constitutional:  Negative for chills and fever.  Eyes:  Negative for visual disturbance.  Respiratory:  Negative for shortness of breath and wheezing.   Cardiovascular:  Negative for chest pain and leg swelling.  Musculoskeletal:  Negative for back pain and gait problem.  Skin:  Negative for rash.  Neurological:  Negative for dizziness, weakness and light-headedness.  All other systems reviewed and are negative.   Per HPI unless specifically indicated above   Allergies as of 08/13/2021   No Known Allergies      Medication List        Accurate as of August 13, 2021  8:51 AM. If you have any questions, ask your nurse or doctor.          STOP taking these medications  HYDROcodone-acetaminophen 5-325 MG tablet Commonly known as: NORCO/VICODIN Stopped by: Fransisca Kaufmann Toribio Seiber, MD       TAKE these medications    allopurinol 300 MG tablet Commonly known as: ZYLOPRIM Take 1 tablet (300 mg total) by mouth daily.   dapagliflozin propanediol 10 MG Tabs tablet Commonly known as: FARXIGA Take 1 tablet (10 mg total) by mouth daily.   Fish Oil 1000 MG Caps Take by mouth. 2 in am 2 in pm   IBUPROFEN PO Take 200 mg by mouth daily as needed.   lisinopril-hydrochlorothiazide 20-25 MG tablet Commonly known as: ZESTORETIC Take 1 tablet by mouth daily.   metFORMIN 500 MG tablet Commonly known as: GLUCOPHAGE Take 2 tablets (1,000 mg total) by mouth 2 (two) times daily with a meal.   multivitamin tablet Take 1 tablet by mouth daily.   omeprazole 40 MG capsule Commonly known as:  PRILOSEC Take 1 capsule (40 mg total) by mouth daily.   PARoxetine 30 MG tablet Commonly known as: PAXIL Take 1 tablet (30 mg total) by mouth daily.   pravastatin 20 MG tablet Commonly known as: PRAVACHOL Take 1 tablet (20 mg total) by mouth daily.   sildenafil 20 MG tablet Commonly known as: REVATIO Take 1-3 tablets (20-60 mg total) by mouth as needed.         Objective:   BP 122/87   Pulse 86   Temp 98.3 F (36.8 C)   Ht '5\' 10"'  (1.778 m)   Wt 219 lb (99.3 kg)   SpO2 95%   BMI 31.42 kg/m   Wt Readings from Last 3 Encounters:  08/13/21 219 lb (99.3 kg)  04/04/21 217 lb (98.4 kg)  12/27/20 213 lb (96.6 kg)    Physical Exam Vitals and nursing note reviewed.  Constitutional:      General: He is not in acute distress.    Appearance: He is well-developed. He is not diaphoretic.  Eyes:     General: No scleral icterus.    Conjunctiva/sclera: Conjunctivae normal.  Neck:     Thyroid: No thyromegaly.  Cardiovascular:     Rate and Rhythm: Normal rate and regular rhythm.     Heart sounds: Normal heart sounds. No murmur heard. Pulmonary:     Effort: Pulmonary effort is normal. No respiratory distress.     Breath sounds: Normal breath sounds. No wheezing.  Musculoskeletal:        General: No swelling. Normal range of motion.     Cervical back: Neck supple.  Lymphadenopathy:     Cervical: No cervical adenopathy.  Skin:    General: Skin is warm and dry.     Findings: No rash.  Neurological:     Mental Status: He is alert and oriented to person, place, and time.     Coordination: Coordination normal.  Psychiatric:        Behavior: Behavior normal.       Assessment & Plan:   Problem List Items Addressed This Visit       Cardiovascular and Mediastinum   Essential hypertension   Relevant Orders   CBC with Differential/Platelet   CMP14+EGFR   Lipid panel   Bayer DCA Hb A1c Waived     Endocrine   Hyperlipidemia associated with type 2 diabetes mellitus  ([place])   Relevant Medications   dapagliflozin propanediol (FARXIGA) 10 MG TABS tablet   metFORMIN (GLUCOPHAGE) 500 MG tablet   Other Relevant Orders   CBC with Differential/Platelet   CMP14+EGFR   Lipid panel  Bayer DCA Hb A1c Waived   Type 2 diabetes mellitus with other specified complication (HCC) - Primary   Relevant Medications   dapagliflozin propanediol (FARXIGA) 10 MG TABS tablet   metFORMIN (GLUCOPHAGE) 500 MG tablet   Other Relevant Orders   CBC with Differential/Platelet   CMP14+EGFR   Lipid panel   Bayer DCA Hb A1c Waived     Other   GAD (generalized anxiety disorder)   Relevant Medications   PARoxetine (PAXIL) 30 MG tablet   Other Visit Diagnoses     Prostate cancer screening       Relevant Orders   PSA, total and free       A1c actually improved at 7.2, will restart Wilder Glade now that he can afford it and continue with diet.   Follow up plan: Return in about 3 months (around 11/13/2021), or if symptoms worsen or fail to improve, for Diabetes hypertension and cholesterol.  Counseling provided for all of the vaccine components Orders Placed This Encounter  Procedures   CBC with Differential/Platelet   CMP14+EGFR   Lipid panel   Bayer DCA Hb A1c Waived   PSA, total and free    Caryl Pina, MD [facility] 08/13/2021, 8:51 AM

## 2021-08-14 LAB — CMP14+EGFR
ALT: 26 IU/L (ref 0–44)
AST: 25 IU/L (ref 0–40)
Albumin/Globulin Ratio: 1.6 (ref 1.2–2.2)
Albumin: 4.6 g/dL (ref 3.8–4.9)
Alkaline Phosphatase: 77 IU/L (ref 44–121)
BUN/Creatinine Ratio: 14 (ref 9–20)
BUN: 14 mg/dL (ref 6–24)
Bilirubin Total: 0.8 mg/dL (ref 0.0–1.2)
CO2: 19 mmol/L — ABNORMAL LOW (ref 20–29)
Calcium: 9.7 mg/dL (ref 8.7–10.2)
Chloride: 96 mmol/L (ref 96–106)
Creatinine, Ser: 1 mg/dL (ref 0.76–1.27)
Globulin, Total: 2.9 g/dL (ref 1.5–4.5)
Glucose: 212 mg/dL — ABNORMAL HIGH (ref 70–99)
Potassium: 4.1 mmol/L (ref 3.5–5.2)
Sodium: 134 mmol/L (ref 134–144)
Total Protein: 7.5 g/dL (ref 6.0–8.5)
eGFR: 88 mL/min/{1.73_m2} (ref >59)

## 2021-08-14 LAB — CBC WITH DIFFERENTIAL/PLATELET
Basophils Absolute: 0 10*3/uL (ref 0.0–0.2)
Basos: 1 %
EOS (ABSOLUTE): 0.2 10*3/uL (ref 0.0–0.4)
Eos: 3 %
Hematocrit: 45.5 % (ref 37.5–51.0)
Hemoglobin: 15.6 g/dL (ref 13.0–17.7)
Immature Grans (Abs): 0 10*3/uL (ref 0.0–0.1)
Immature Granulocytes: 0 %
Lymphocytes Absolute: 1.6 10*3/uL (ref 0.7–3.1)
Lymphs: 24 %
MCH: 32.2 pg (ref 26.6–33.0)
MCHC: 34.3 g/dL (ref 31.5–35.7)
MCV: 94 fL (ref 79–97)
Monocytes Absolute: 0.6 10*3/uL (ref 0.1–0.9)
Monocytes: 9 %
Neutrophils Absolute: 4.3 10*3/uL (ref 1.4–7.0)
Neutrophils: 63 %
Platelets: 241 10*3/uL (ref 150–450)
RBC: 4.84 x10E6/uL (ref 4.14–5.80)
RDW: 12.3 % (ref 11.6–15.4)
WBC: 6.7 10*3/uL (ref 3.4–10.8)

## 2021-08-14 LAB — PSA, TOTAL AND FREE
PSA, Free Pct: 33.3 %
PSA, Free: 0.2 ng/mL
Prostate Specific Ag, Serum: 0.6 ng/mL (ref 0.0–4.0)

## 2021-08-14 LAB — LIPID PANEL
Chol/HDL Ratio: 4.5 ratio (ref 0.0–5.0)
Cholesterol, Total: 174 mg/dL (ref 100–199)
HDL: 39 mg/dL — ABNORMAL LOW (ref >39)
LDL Chol Calc (NIH): 67 mg/dL (ref 0–99)
Triglycerides: 439 mg/dL — ABNORMAL HIGH (ref 0–149)
VLDL Cholesterol Cal: 68 mg/dL — ABNORMAL HIGH (ref 5–40)

## 2021-11-15 ENCOUNTER — Ambulatory Visit: Payer: BC Managed Care – PPO | Admitting: Family Medicine

## 2021-11-19 ENCOUNTER — Ambulatory Visit: Payer: BC Managed Care – PPO | Admitting: Family Medicine

## 2021-11-19 ENCOUNTER — Encounter: Payer: Self-pay | Admitting: Family Medicine

## 2021-11-19 VITALS — BP 126/89 | HR 73 | Temp 98.0°F | Ht 70.0 in | Wt 217.0 lb

## 2021-11-19 DIAGNOSIS — Z23 Encounter for immunization: Secondary | ICD-10-CM

## 2021-11-19 DIAGNOSIS — E785 Hyperlipidemia, unspecified: Secondary | ICD-10-CM | POA: Diagnosis not present

## 2021-11-19 DIAGNOSIS — E1169 Type 2 diabetes mellitus with other specified complication: Secondary | ICD-10-CM

## 2021-11-19 DIAGNOSIS — I10 Essential (primary) hypertension: Secondary | ICD-10-CM

## 2021-11-19 LAB — BAYER DCA HB A1C WAIVED: HB A1C (BAYER DCA - WAIVED): 8.3 % — ABNORMAL HIGH (ref 4.8–5.6)

## 2021-11-19 MED ORDER — DAPAGLIFLOZIN PROPANEDIOL 10 MG PO TABS: 10.0000 mg | ORAL_TABLET | Freq: Every day | ORAL | 3 refills | Status: DC

## 2021-11-19 MED ORDER — ALLOPURINOL 300 MG PO TABS: 300.0000 mg | ORAL_TABLET | Freq: Every day | ORAL | 3 refills | Status: DC

## 2021-11-19 NOTE — Progress Notes (Signed)
BP 126/89   Pulse 73   Temp 98 F (36.7 C)   Ht '5\' 10"'$  (1.778 m)   Wt 217 lb (98.4 kg)   SpO2 95%   BMI 31.14 kg/m    Subjective:   Patient ID: Alejandro Rivera, male    DOB: [birth year], 56 y.o.   MRN: [id number]  HPI: Alejandro Rivera is a 56 y.o. male presenting on 11/19/2021 for Medical Management of Chronic Issues, Hyperlipidemia, Diabetes, and Anxiety   HPI Type 2 diabetes mellitus Patient comes in today for recheck of his diabetes. Patient has been currently taking metformin. Patient is currently on an ACE inhibitor/ARB. Patient has not seen an ophthalmologist this year. Patient denies any issues with their feet. The symptom started onset as an adult hypertension and hyperlipidemia ARE RELATED TO DM   Hyperlipidemia Patient is coming in for recheck of his hyperlipidemia. The patient is currently taking fish oil and pravastatin. They deny any issues with myalgias or history of liver damage from it. They deny any focal numbness or weakness or chest pain.   Hypertension Patient is currently on lisinopril hydrochlorothiazide, and their blood pressure today is 126/89. Patient denies any lightheadedness or dizziness. Patient denies headaches, blurred vision, chest pains, shortness of breath, or weakness. Denies any side effects from medication and is content with current medication.   Relevant past medical, surgical, family and social history reviewed and updated as indicated. Interim medical history since our last visit reviewed. Allergies and medications reviewed and updated.  Review of Systems  Constitutional:  Negative for chills and fever.  Eyes:  Negative for visual disturbance.  Respiratory:  Negative for shortness of breath and wheezing.   Cardiovascular:  Negative for chest pain and leg swelling.  Musculoskeletal:  Negative for back pain and gait problem.  Skin:  Negative for rash.  Neurological:  Negative for dizziness, weakness and light-headedness.  All other  systems reviewed and are negative.   Per HPI unless specifically indicated above   Allergies as of 11/19/2021   No Known Allergies      Medication List        Accurate as of November 19, 2021  2:08 PM. If you have any questions, ask your nurse or doctor.          allopurinol 300 MG tablet Commonly known as: ZYLOPRIM Take 1 tablet (300 mg total) by mouth daily.   dapagliflozin propanediol 10 MG Tabs tablet Commonly known as: FARXIGA Take 1 tablet (10 mg total) by mouth daily.   Fish Oil 1000 MG Caps Take by mouth. 2 in am 2 in pm   IBUPROFEN PO Take 200 mg by mouth daily as needed.   lisinopril-hydrochlorothiazide 20-25 MG tablet Commonly known as: ZESTORETIC Take 1 tablet by mouth daily.   metFORMIN 500 MG tablet Commonly known as: GLUCOPHAGE Take 2 tablets (1,000 mg total) by mouth 2 (two) times daily with a meal.   multivitamin tablet Take 1 tablet by mouth daily.   omeprazole 40 MG capsule Commonly known as: PRILOSEC Take 1 capsule (40 mg total) by mouth daily.   PARoxetine 30 MG tablet Commonly known as: PAXIL Take 1 tablet (30 mg total) by mouth daily.   pravastatin 20 MG tablet Commonly known as: PRAVACHOL Take 1 tablet (20 mg total) by mouth daily.   sildenafil 20 MG tablet Commonly known as: REVATIO Take 1-3 tablets (20-60 mg total) by mouth as needed.         Objective:  BP 126/89   Pulse 73   Temp 98 F (36.7 C)   Ht '5\' 10"'$  (1.778 m)   Wt 217 lb (98.4 kg)   SpO2 95%   BMI 31.14 kg/m   Wt Readings from Last 3 Encounters:  11/19/21 217 lb (98.4 kg)  08/13/21 219 lb (99.3 kg)  04/04/21 217 lb (98.4 kg)    Physical Exam Vitals and nursing note reviewed.  Constitutional:      General: He is not in acute distress.    Appearance: He is well-developed. He is not diaphoretic.  Eyes:     General: No scleral icterus.    Conjunctiva/sclera: Conjunctivae normal.  Neck:     Thyroid: No thyromegaly.  Cardiovascular:     Rate and  Rhythm: Normal rate and regular rhythm.     Heart sounds: Normal heart sounds. No murmur heard. Pulmonary:     Effort: Pulmonary effort is normal. No respiratory distress.     Breath sounds: Normal breath sounds. No wheezing.  Musculoskeletal:        General: Normal range of motion.     Cervical back: Neck supple.  Lymphadenopathy:     Cervical: No cervical adenopathy.  Skin:    General: Skin is warm and dry.     Findings: No rash.  Neurological:     Mental Status: He is alert and oriented to person, place, and time.     Coordination: Coordination normal.  Psychiatric:        Behavior: Behavior normal.       Assessment & Plan:   Problem List Items Addressed This Visit       Cardiovascular and Mediastinum   Essential hypertension     Endocrine   Hyperlipidemia associated with type 2 diabetes mellitus (Standard)   Relevant Medications   dapagliflozin propanediol (FARXIGA) 10 MG TABS tablet   Type 2 diabetes mellitus with other specified complication (HCC) - Primary   Relevant Medications   dapagliflozin propanediol (FARXIGA) 10 MG TABS tablet   Other Relevant Orders   Bayer DCA Hb A1c Waived   Microalbumin / creatinine urine ratio   Other Visit Diagnoses     Need for immunization against influenza       Relevant Orders   Flu Vaccine QUAD 44moIM (Fluarix, Fluzone & Alfiuria Quad PF) (Completed)       A1c is up at 8.3.  He never got his FIran  We never have record of the PA so we are going to send the FIranagain and see if it gets triggered.  We will need to see if we can get it covered.   Follow up plan: Return in about 3 months (around 02/19/2022), or if symptoms worsen or fail to improve, for Diabetes hypertension and cholesterol.  Counseling provided for all of the vaccine components Orders Placed This Encounter  Procedures   Flu Vaccine QUAD 619moM (Fluarix, Fluzone & Alfiuria Quad PF)   Bayer DCA Hb A1c Waived   Microalbumin / creatinine urine ratio     JoCaryl PinaMD [facility] 11/19/2021, 2:08 PM

## 2021-11-26 ENCOUNTER — Telehealth: Payer: Self-pay | Admitting: Family Medicine

## 2021-11-26 NOTE — Telephone Encounter (Signed)
Left message informing pt that received the PA today and will start to work on it. Advised that we would call when complete. Also informed that his insurance listed alternatives and that we may have to switch to that.

## 2021-11-26 NOTE — Telephone Encounter (Signed)
Pt says he cant get his [place] refilled because it requires PA and insurance still hasnt approved. Needs advise on what to do.

## 2021-11-27 ENCOUNTER — Telehealth: Payer: Self-pay

## 2021-11-27 NOTE — Telephone Encounter (Signed)
Outcome Additional Information Required Available without authorization. Drug Farxiga '10MG'$  tablets Form Librarian, academic PA Form [phone] NCPDP)  Britton and pt made aware.

## 2022-02-22 ENCOUNTER — Encounter: Payer: Self-pay | Admitting: Family Medicine

## 2022-02-22 ENCOUNTER — Ambulatory Visit: Payer: BC Managed Care – PPO | Admitting: Family Medicine

## 2022-02-22 VITALS — BP 115/72 | HR 91 | Temp 98.4°F | Ht 70.0 in | Wt 213.0 lb

## 2022-02-22 DIAGNOSIS — K219 Gastro-esophageal reflux disease without esophagitis: Secondary | ICD-10-CM | POA: Diagnosis not present

## 2022-02-22 DIAGNOSIS — E1169 Type 2 diabetes mellitus with other specified complication: Secondary | ICD-10-CM

## 2022-02-22 DIAGNOSIS — I10 Essential (primary) hypertension: Secondary | ICD-10-CM | POA: Diagnosis not present

## 2022-02-22 DIAGNOSIS — E785 Hyperlipidemia, unspecified: Secondary | ICD-10-CM

## 2022-02-22 LAB — CBC WITH DIFFERENTIAL/PLATELET
Basophils Absolute: 0 10*3/uL (ref 0.0–0.2)
Basos: 1 %
EOS (ABSOLUTE): 0.1 10*3/uL (ref 0.0–0.4)
Eos: 2 %
Hematocrit: 47.7 % (ref 37.5–51.0)
Hemoglobin: 16 g/dL (ref 13.0–17.7)
Immature Grans (Abs): 0 10*3/uL (ref 0.0–0.1)
Immature Granulocytes: 0 %
Lymphocytes Absolute: 1.2 10*3/uL (ref 0.7–3.1)
Lymphs: 15 %
MCH: 31.4 pg (ref 26.6–33.0)
MCHC: 33.5 g/dL (ref 31.5–35.7)
MCV: 94 fL (ref 79–97)
Monocytes Absolute: 0.8 10*3/uL (ref 0.1–0.9)
Monocytes: 10 %
Neutrophils Absolute: 5.7 10*3/uL (ref 1.4–7.0)
Neutrophils: 72 %
Platelets: 230 10*3/uL (ref 150–450)
RBC: 5.09 x10E6/uL (ref 4.14–5.80)
RDW: 12 % (ref 11.6–15.4)
WBC: 7.8 10*3/uL (ref 3.4–10.8)

## 2022-02-22 LAB — LIPID PANEL
Chol/HDL Ratio: 4.2 ratio (ref 0.0–5.0)
Cholesterol, Total: 146 mg/dL (ref 100–199)
HDL: 35 mg/dL — ABNORMAL LOW (ref >39)
LDL Chol Calc (NIH): 53 mg/dL (ref 0–99)
Triglycerides: 384 mg/dL — ABNORMAL HIGH (ref 0–149)
VLDL Cholesterol Cal: 58 mg/dL — ABNORMAL HIGH (ref 5–40)

## 2022-02-22 LAB — CMP14+EGFR
ALT: 17 IU/L (ref 0–44)
AST: 19 IU/L (ref 0–40)
Albumin/Globulin Ratio: 1.6 (ref 1.2–2.2)
Albumin: 4.4 g/dL (ref 3.8–4.9)
Alkaline Phosphatase: 98 IU/L (ref 44–121)
BUN/Creatinine Ratio: 15 (ref 9–20)
BUN: 16 mg/dL (ref 6–24)
Bilirubin Total: 0.8 mg/dL (ref 0.0–1.2)
CO2: 20 mmol/L (ref 20–29)
Calcium: 9.5 mg/dL (ref 8.7–10.2)
Chloride: 93 mmol/L — ABNORMAL LOW (ref 96–106)
Creatinine, Ser: 1.06 mg/dL (ref 0.76–1.27)
Globulin, Total: 2.8 g/dL (ref 1.5–4.5)
Glucose: 173 mg/dL — ABNORMAL HIGH (ref 70–99)
Potassium: 4.2 mmol/L (ref 3.5–5.2)
Sodium: 132 mmol/L — ABNORMAL LOW (ref 134–144)
Total Protein: 7.2 g/dL (ref 6.0–8.5)
eGFR: 82 mL/min/{1.73_m2} (ref >59)

## 2022-02-22 LAB — BAYER DCA HB A1C WAIVED: HB A1C (BAYER DCA - WAIVED): 9 % — ABNORMAL HIGH (ref 4.8–5.6)

## 2022-02-22 MED ORDER — OMEPRAZOLE 40 MG PO CPDR: 40.0000 mg | DELAYED_RELEASE_CAPSULE | Freq: Every day | ORAL | 3 refills | Status: DC

## 2022-02-22 MED ORDER — RYBELSUS 7 MG PO TABS: 7.0000 mg | ORAL_TABLET | Freq: Every day | ORAL | 1 refills | Status: DC

## 2022-02-22 MED ORDER — LISINOPRIL-HYDROCHLOROTHIAZIDE 20-25 MG PO TABS: 1.0000 | ORAL_TABLET | Freq: Every day | ORAL | 3 refills | Status: DC

## 2022-02-22 MED ORDER — PRAVASTATIN SODIUM 20 MG PO TABS: 20.0000 mg | ORAL_TABLET | Freq: Every day | ORAL | 3 refills | Status: DC

## 2022-02-22 NOTE — Addendum Note (Signed)
Addended by: Alphonzo Dublin on: 02/22/2022 10:22 AM   Modules accepted: Orders

## 2022-02-22 NOTE — Progress Notes (Signed)
BP 115/72   Pulse 91   Temp 98.4 F (36.9 C)   Ht '5\' 10"'$  (1.778 m)   Wt 213 lb (96.6 kg)   SpO2 96%   BMI 30.56 kg/m    Subjective:   Patient ID: Alejandro Rivera, male    DOB: 02-Feb-1966, 57 y.o.   MRN: [id number]  HPI: Alejandro Rivera is a 57 y.o. male presenting on 02/22/2022 for Medical Management of Chronic Issues, Diabetes, Anxiety, Hypertension, and Hyperlipidemia   HPI Type 2 diabetes mellitus Patient comes in today for recheck of his diabetes. Patient has been currently taking metformin and [place]. Patient is currently on an ACE inhibitor/ARB. Patient has not seen an ophthalmologist this year. Patient denies any issues with their feet. The symptom started onset as an adult hypertension and hyperlipidemia ARE RELATED TO DM   Hypertension Patient is currently on lisinopril hydrochlorothiazide, and their blood pressure today is 115/72. Patient denies any lightheadedness or dizziness. Patient denies headaches, blurred vision, chest pains, shortness of breath, or weakness. Denies any side effects from medication and is content with current medication.   Hyperlipidemia Patient is coming in for recheck of his hyperlipidemia. The patient is currently taking pravastatin. They deny any issues with myalgias or history of liver damage from it. They deny any focal numbness or weakness or chest pain.   GERD Patient is currently on omeprazole.  She denies any major symptoms or abdominal pain or belching or burping. She denies any blood in her stool or lightheadedness or dizziness.   Relevant past medical, surgical, family and social history reviewed and updated as indicated. Interim medical history since our last visit reviewed. Allergies and medications reviewed and updated.  Review of Systems  Constitutional:  Negative for chills and fever.  Eyes:  Negative for visual disturbance.  Respiratory:  Negative for shortness of breath and wheezing.   Cardiovascular:  Negative for  chest pain and leg swelling.  Musculoskeletal:  Negative for back pain and gait problem.  Skin:  Negative for rash.  Neurological:  Negative for dizziness, weakness and light-headedness.  All other systems reviewed and are negative.   Per HPI unless specifically indicated above   Allergies as of 02/22/2022   No Known Allergies      Medication List        Accurate as of February 22, 2022  9:57 AM. If you have any questions, ask your nurse or doctor.          allopurinol 300 MG tablet Commonly known as: ZYLOPRIM Take 1 tablet (300 mg total) by mouth daily.   dapagliflozin propanediol 10 MG Tabs tablet Commonly known as: FARXIGA Take 1 tablet (10 mg total) by mouth daily.   Fish Oil 1000 MG Caps Take by mouth. 2 in am 2 in pm   IBUPROFEN PO Take 200 mg by mouth daily as needed.   lisinopril-hydrochlorothiazide 20-25 MG tablet Commonly known as: ZESTORETIC Take 1 tablet by mouth daily.   metFORMIN 500 MG tablet Commonly known as: GLUCOPHAGE Take 2 tablets (1,000 mg total) by mouth 2 (two) times daily with a meal.   multivitamin tablet Take 1 tablet by mouth daily.   omeprazole 40 MG capsule Commonly known as: PRILOSEC Take 1 capsule (40 mg total) by mouth daily.   PARoxetine 30 MG tablet Commonly known as: PAXIL Take 1 tablet (30 mg total) by mouth daily.   pravastatin 20 MG tablet Commonly known as: PRAVACHOL Take 1 tablet (20 mg total) by mouth  daily.   Rybelsus 7 MG Tabs Generic drug: Semaglutide Take 7 mg by mouth daily. Started by: Worthy Rancher, MD   sildenafil 20 MG tablet Commonly known as: REVATIO Take 1-3 tablets (20-60 mg total) by mouth as needed.         Objective:   BP 115/72   Pulse 91   Temp 98.4 F (36.9 C)   Ht '5\' 10"'$  (1.778 m)   Wt 213 lb (96.6 kg)   SpO2 96%   BMI 30.56 kg/m   Wt Readings from Last 3 Encounters:  02/22/22 213 lb (96.6 kg)  11/19/21 217 lb (98.4 kg)  08/13/21 219 lb (99.3 kg)    Physical  Exam Vitals and nursing note reviewed.  Constitutional:      General: He is not in acute distress.    Appearance: He is well-developed. He is not diaphoretic.  Eyes:     General: No scleral icterus.    Conjunctiva/sclera: Conjunctivae normal.  Neck:     Thyroid: No thyromegaly.  Cardiovascular:     Rate and Rhythm: Normal rate and regular rhythm.     Heart sounds: Normal heart sounds. No murmur heard. Pulmonary:     Effort: Pulmonary effort is normal. No respiratory distress.     Breath sounds: Normal breath sounds. No wheezing.  Musculoskeletal:        General: No swelling. Normal range of motion.     Cervical back: Neck supple.  Lymphadenopathy:     Cervical: No cervical adenopathy.  Skin:    General: Skin is warm and dry.     Findings: No rash.  Neurological:     Mental Status: He is alert and oriented to person, place, and time.     Coordination: Coordination normal.  Psychiatric:        Behavior: Behavior normal.       Assessment & Plan:   Problem List Items Addressed This Visit       Cardiovascular and Mediastinum   Essential hypertension   Relevant Medications   lisinopril-hydrochlorothiazide (ZESTORETIC) 20-25 MG tablet   pravastatin (PRAVACHOL) 20 MG tablet   Other Relevant Orders   CBC with Differential/Platelet   CMP14+EGFR   Lipid panel   Bayer DCA Hb A1c Waived     Digestive   GERD (gastroesophageal reflux disease)   Relevant Medications   omeprazole (PRILOSEC) 40 MG capsule     Endocrine   Hyperlipidemia associated with type 2 diabetes mellitus (HCC)   Relevant Medications   lisinopril-hydrochlorothiazide (ZESTORETIC) 20-25 MG tablet   pravastatin (PRAVACHOL) 20 MG tablet   Semaglutide (RYBELSUS) 7 MG TABS   Other Relevant Orders   CBC with Differential/Platelet   CMP14+EGFR   Lipid panel   Bayer DCA Hb A1c Waived   Type 2 diabetes mellitus with other specified complication (HCC) - Primary   Relevant Medications    lisinopril-hydrochlorothiazide (ZESTORETIC) 20-25 MG tablet   pravastatin (PRAVACHOL) 20 MG tablet   Semaglutide (RYBELSUS) 7 MG TABS   Other Relevant Orders   CBC with Differential/Platelet   CMP14+EGFR   Lipid panel   Bayer DCA Hb A1c Waived    Patient's blood pressure looks good, no change. Patient's A1c is A1c is 9.0, give a sample of Rybelsus 3 mg for a month and then will go up to 7 mg. Follow up plan: Return in about 3 months (around 05/24/2022), or if symptoms worsen or fail to improve, for Diabetes and hypertension and cholesterol recheck.  Counseling provided for all  of the vaccine components Orders Placed This Encounter  Procedures   CBC with Differential/Platelet   CMP14+EGFR   Lipid panel   Bayer DCA Hb A1c Waived    Caryl Pina, MD [facility] 02/22/2022, 9:57 AM

## 2022-02-23 LAB — MICROALBUMIN / CREATININE URINE RATIO
Creatinine, Urine: 90.3 mg/dL
Microalb/Creat Ratio: 30 mg/g creat — ABNORMAL HIGH (ref 0–29)
Microalbumin, Urine: 27.4 ug/mL

## 2022-05-29 ENCOUNTER — Ambulatory Visit: Payer: BC Managed Care – PPO | Admitting: Family Medicine

## 2022-06-12 ENCOUNTER — Ambulatory Visit (INDEPENDENT_AMBULATORY_CARE_PROVIDER_SITE_OTHER): Payer: Self-pay | Admitting: Family Medicine

## 2022-06-12 ENCOUNTER — Encounter: Payer: Self-pay | Admitting: Family Medicine

## 2022-06-12 VITALS — BP 103/70 | HR 83 | Ht 70.0 in | Wt 208.0 lb

## 2022-06-12 DIAGNOSIS — Z0001 Encounter for general adult medical examination with abnormal findings: Secondary | ICD-10-CM

## 2022-06-12 DIAGNOSIS — E785 Hyperlipidemia, unspecified: Secondary | ICD-10-CM

## 2022-06-12 DIAGNOSIS — E1169 Type 2 diabetes mellitus with other specified complication: Secondary | ICD-10-CM

## 2022-06-12 DIAGNOSIS — Z0000 Encounter for general adult medical examination without abnormal findings: Secondary | ICD-10-CM

## 2022-06-12 DIAGNOSIS — Z111 Encounter for screening for respiratory tuberculosis: Secondary | ICD-10-CM

## 2022-06-12 DIAGNOSIS — I10 Essential (primary) hypertension: Secondary | ICD-10-CM

## 2022-06-12 DIAGNOSIS — F411 Generalized anxiety disorder: Secondary | ICD-10-CM

## 2022-06-12 DIAGNOSIS — I152 Hypertension secondary to endocrine disorders: Secondary | ICD-10-CM

## 2022-06-12 DIAGNOSIS — Z7984 Long term (current) use of oral hypoglycemic drugs: Secondary | ICD-10-CM

## 2022-06-12 LAB — BAYER DCA HB A1C WAIVED: HB A1C (BAYER DCA - WAIVED): 7.2 % — ABNORMAL HIGH (ref 4.8–5.6)

## 2022-06-12 MED ORDER — PAROXETINE HCL 30 MG PO TABS: 30.0000 mg | ORAL_TABLET | Freq: Every day | ORAL | 3 refills | Status: DC

## 2022-06-12 MED ORDER — METFORMIN HCL 500 MG PO TABS: 1000.0000 mg | ORAL_TABLET | Freq: Two times a day (BID) | ORAL | 3 refills | Status: DC

## 2022-06-12 MED ORDER — SEMAGLUTIDE(0.25 OR 0.5MG/DOS) 2 MG/3ML ~~LOC~~ SOPN
0.2500 mg | PEN_INJECTOR | SUBCUTANEOUS | 0 refills | Status: DC
Start: 2022-06-12 — End: 2022-08-28

## 2022-06-12 NOTE — Progress Notes (Addendum)
Ht 5\' 10"  (1.778 m)   BMI 30.56 kg/m    Subjective:   Patient ID: Alejandro Rivera, male    DOB: [birth year], 57 y.o.   MRN: [id number]  HPI: Alejandro Rivera is a 57 y.o. male presenting on 06/12/2022 for Medical Management of Chronic Issues, Diabetes, and Anxiety   HPI Physical exam Patient denies any chest pain, shortness of breath, headaches or vision issues, abdominal complaints, diarrhea, nausea, vomiting, or joint issues.  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  Currently in between insurance with his new employer wanting to get a physical prior to joining the job.  Type 2 diabetes mellitus Patient comes in today for recheck of his diabetes. Patient has been currently taking [place] and metformin, did not tolerate Rybelsus because of stomach issues. Patient is currently on an ACE inhibitor/ARB. Patient has not seen an ophthalmologist this year. Patient denies any new issues with their feet. The symptom started onset as an adult hypertension and hyperlipidemia ARE RELATED TO DM   Hypertension Patient is currently on lisinopril hydrochlorothiazide, and their blood pressure today is 103/70. Patient denies any lightheadedness or dizziness. Patient denies headaches, blurred vision, chest pains, shortness of breath, or weakness. Denies any side effects from medication and is content with current medication.   Hyperlipidemia Patient is coming in for recheck of his hyperlipidemia. The patient is currently taking fish oil and pravastatin. They deny any issues with myalgias or history of liver damage from it. They deny any focal numbness or weakness or chest pain.   Relevant past medical, surgical, family and social history reviewed and updated as indicated. Interim medical history since our last visit reviewed. Allergies and medications reviewed and updated.  Review of Systems  Constitutional:  Negative for chills and fever.  Eyes:  Negative for visual disturbance.   Respiratory:  Negative for shortness of breath and wheezing.   Cardiovascular:  Negative for chest pain and leg swelling.  Musculoskeletal:  Negative for back pain and gait problem.  Skin:  Negative for rash.  Neurological:  Negative for dizziness, weakness and light-headedness.  All other systems reviewed and are negative.   Per HPI unless specifically indicated above   Allergies as of 06/12/2022   No Known Allergies      Medication List        Accurate as of Jun 12, 2022  1:56 PM. If you have any questions, ask your nurse or doctor.          STOP taking these medications    Rybelsus 7 MG Tabs Generic drug: Semaglutide Replaced by: Semaglutide(0.25 or 0.5MG /DOS) 2 MG/3ML Sopn Stopped by: Elige Radon Yoltzin Ransom, MD       TAKE these medications    allopurinol 300 MG tablet Commonly known as: ZYLOPRIM Take 1 tablet (300 mg total) by mouth daily.   dapagliflozin propanediol 10 MG Tabs tablet Commonly known as: FARXIGA Take 1 tablet (10 mg total) by mouth daily.   Fish Oil 1000 MG Caps Take by mouth. 2 in am 2 in pm   IBUPROFEN PO Take 200 mg by mouth daily as needed.   lisinopril-hydrochlorothiazide 20-25 MG tablet Commonly known as: ZESTORETIC Take 1 tablet by mouth daily.   metFORMIN 500 MG tablet Commonly known as: GLUCOPHAGE Take 2 tablets (1,000 mg total) by mouth 2 (two) times daily with a meal.   multivitamin tablet Take 1 tablet by mouth daily.   omeprazole 40 MG capsule Commonly known as: PRILOSEC Take 1  capsule (40 mg total) by mouth daily.   PARoxetine 30 MG tablet Commonly known as: PAXIL Take 1 tablet (30 mg total) by mouth daily.   pravastatin 20 MG tablet Commonly known as: PRAVACHOL Take 1 tablet (20 mg total) by mouth daily.   Semaglutide(0.25 or 0.5MG /DOS) 2 MG/3ML Sopn Inject 0.25 mg into the skin once a week. Replaces: Rybelsus 7 MG Tabs Started by: Elige Radon Loudon Krakow, MD   sildenafil 20 MG tablet Commonly known as:  REVATIO Take 1-3 tablets (20-60 mg total) by mouth as needed.         Objective:   Ht 5\' 10"  (1.778 m)   BMI 30.56 kg/m   Wt Readings from Last 3 Encounters:  02/22/22 213 lb (96.6 kg)  11/19/21 217 lb (98.4 kg)  08/13/21 219 lb (99.3 kg)    Physical Exam Vitals and nursing note reviewed.  Constitutional:      General: He is not in acute distress.    Appearance: He is well-developed. He is not diaphoretic.  Eyes:     General: No scleral icterus.    Conjunctiva/sclera: Conjunctivae normal.  Neck:     Thyroid: No thyromegaly.  Cardiovascular:     Rate and Rhythm: Normal rate and regular rhythm.     Heart sounds: Normal heart sounds. No murmur heard. Pulmonary:     Effort: Pulmonary effort is normal. No respiratory distress.     Breath sounds: Normal breath sounds. No wheezing.  Musculoskeletal:        General: No swelling. Normal range of motion.     Cervical back: Neck supple.  Lymphadenopathy:     Cervical: No cervical adenopathy.  Skin:    General: Skin is warm and dry.     Findings: No rash.  Neurological:     Mental Status: He is alert and oriented to person, place, and time.     Coordination: Coordination normal.  Psychiatric:        Behavior: Behavior normal.       Assessment & Plan:   Problem List Items Addressed This Visit       Cardiovascular and Mediastinum   Essential hypertension     Endocrine   Hyperlipidemia associated with type 2 diabetes mellitus (HCC)   Relevant Medications   metFORMIN (GLUCOPHAGE) 500 MG tablet   Semaglutide,0.25 or 0.5MG /DOS, 2 MG/3ML SOPN   Type 2 diabetes mellitus with other specified complication (HCC)   Relevant Medications   metFORMIN (GLUCOPHAGE) 500 MG tablet   Semaglutide,0.25 or 0.5MG /DOS, 2 MG/3ML SOPN   Other Relevant Orders   Bayer DCA Hb A1c Waived     Other   GAD (generalized anxiety disorder)   Relevant Medications   PARoxetine (PAXIL) 30 MG tablet   Other Visit Diagnoses     Physical exam     -  Primary     A1c 7.2 which is improved but now he stopped the Rybelsus, will add Ozempic at a low dose, gave sample for today.  Follow up plan: Return in about 3 months (around 09/12/2022), or if symptoms worsen or fail to improve, for Diabetes hypertension and cholesterol.  Counseling provided for all of the vaccine components Orders Placed This Encounter  Procedures   Bayer DCA Hb A1c Waived    Arville Care, MD [facility] 06/12/2022, 1:56 PM

## 2022-06-12 NOTE — Addendum Note (Signed)
Addended by: Dorene Sorrow on: 06/12/2022 02:16 PM   Modules accepted: Orders

## 2022-06-14 ENCOUNTER — Ambulatory Visit: Payer: Self-pay

## 2022-06-14 DIAGNOSIS — Z111 Encounter for screening for respiratory tuberculosis: Secondary | ICD-10-CM

## 2022-06-14 LAB — TB SKIN TEST
Induration: 0 mm
TB Skin Test: NEGATIVE

## 2022-06-14 NOTE — Progress Notes (Signed)
Patient here to have TB skin test that was placed on 06/12/22 read.  Results are negative.

## 2022-07-16 ENCOUNTER — Telehealth: Payer: Self-pay | Admitting: Family Medicine

## 2022-07-16 NOTE — Telephone Encounter (Signed)
Pt called stating that he does not have insurance at this time and wants to know if we have samples of Farxiga and Ozempic that he could have?

## 2022-07-17 NOTE — Telephone Encounter (Signed)
One box of Farxiga 10 and one box of 0.25mg   Ozempic is up front for pt.  Recommendations given on GoodRx.  Pt states tat he is changing jobs and should have insurance soon.  Pt states that he should be here before the end of the day to pick up samples.

## 2022-08-28 ENCOUNTER — Telehealth: Payer: Self-pay | Admitting: Family Medicine

## 2022-08-28 MED ORDER — SEMAGLUTIDE(0.25 OR 0.5MG/DOS) 2 MG/3ML ~~LOC~~ SOPN: 0.2500 mg | PEN_INJECTOR | SUBCUTANEOUS | 3 refills | Status: DC

## 2022-08-28 NOTE — Telephone Encounter (Signed)
Did you read my last note, I said I sent in a prescription of Ozempic for him

## 2022-08-28 NOTE — Telephone Encounter (Signed)
Sent a prescription for Ozempic in for him.

## 2022-09-12 ENCOUNTER — Ambulatory Visit (INDEPENDENT_AMBULATORY_CARE_PROVIDER_SITE_OTHER): Payer: BC Managed Care – PPO | Admitting: Family Medicine

## 2022-09-12 ENCOUNTER — Encounter: Payer: Self-pay | Admitting: Family Medicine

## 2022-09-12 VITALS — BP 104/72 | HR 65 | Ht 70.0 in | Wt 200.0 lb

## 2022-09-12 DIAGNOSIS — E1169 Type 2 diabetes mellitus with other specified complication: Secondary | ICD-10-CM | POA: Diagnosis not present

## 2022-09-12 DIAGNOSIS — E785 Hyperlipidemia, unspecified: Secondary | ICD-10-CM | POA: Diagnosis not present

## 2022-09-12 DIAGNOSIS — I10 Essential (primary) hypertension: Secondary | ICD-10-CM | POA: Diagnosis not present

## 2022-09-12 LAB — BAYER DCA HB A1C WAIVED: HB A1C (BAYER DCA - WAIVED): 6.7 % — ABNORMAL HIGH (ref 4.8–5.6)

## 2022-09-12 MED ORDER — DAPAGLIFLOZIN PROPANEDIOL 10 MG PO TABS: 10.0000 mg | ORAL_TABLET | Freq: Every day | ORAL | 3 refills | Status: DC

## 2022-09-12 MED ORDER — ALLOPURINOL 300 MG PO TABS: 300.0000 mg | ORAL_TABLET | Freq: Every day | ORAL | 3 refills | Status: DC

## 2022-09-12 NOTE — Progress Notes (Signed)
BP 104/72   Pulse 65   Ht 5\' 10"  (1.778 m)   Wt 200 lb (90.7 kg)   SpO2 97%   BMI 28.70 kg/m    Subjective:   Patient ID: Alejandro Rivera, male    DOB: [birth year], 57 y.o.   MRN: [id number]  HPI: Alejandro Rivera is a 57 y.o. male presenting on 09/12/2022 for Medical Management of Chronic Issues, Diabetes, and Anxiety   HPI Type 2 diabetes mellitus Patient comes in today for recheck of his diabetes. Patient has been currently taking metformin and Ozempic. Patient is currently on an ACE inhibitor/ARB. Patient has not seen an ophthalmologist this year. Patient denies any new issues with their feet. The symptom started onset as an adult hypertension and hyperlipidemia ARE RELATED TO DM   Hyperlipidemia Patient is coming in for recheck of his hyperlipidemia. The patient is currently taking pravastatin. They deny any issues with myalgias or history of liver damage from it. They deny any focal numbness or weakness or chest pain.   Hypertension Patient is currently on lisinopril-hydrochlorothiazide, and their blood pressure today is 104/72. Patient denies any lightheadedness or dizziness. Patient denies headaches, blurred vision, chest pains, shortness of breath, or weakness. Denies any side effects from medication and is content with current medication.   Relevant past medical, surgical, family and social history reviewed and updated as indicated. Interim medical history since our last visit reviewed. Allergies and medications reviewed and updated.  Review of Systems  Constitutional:  Negative for chills and fever.  Eyes:  Negative for visual disturbance.  Respiratory:  Negative for shortness of breath and wheezing.   Cardiovascular:  Negative for chest pain and leg swelling.  Musculoskeletal:  Negative for back pain and gait problem.  Skin:  Negative for rash.  Neurological:  Negative for dizziness and light-headedness.  All other systems reviewed and are negative.   Per HPI  unless specifically indicated above   Allergies as of 09/12/2022   No Known Allergies      Medication List        Accurate as of September 12, 2022  3:31 PM. If you have any questions, ask your nurse or doctor.          allopurinol 300 MG tablet Commonly known as: ZYLOPRIM Take 1 tablet (300 mg total) by mouth daily.   dapagliflozin propanediol 10 MG Tabs tablet Commonly known as: FARXIGA Take 1 tablet (10 mg total) by mouth daily.   Fish Oil 1000 MG Caps Take by mouth. 2 in am 2 in pm   IBUPROFEN PO Take 200 mg by mouth daily as needed.   lisinopril-hydrochlorothiazide 20-25 MG tablet Commonly known as: ZESTORETIC Take 1 tablet by mouth daily.   metFORMIN 500 MG tablet Commonly known as: GLUCOPHAGE Take 2 tablets (1,000 mg total) by mouth 2 (two) times daily with a meal.   multivitamin tablet Take 1 tablet by mouth daily.   omeprazole 40 MG capsule Commonly known as: PRILOSEC Take 1 capsule (40 mg total) by mouth daily.   PARoxetine 30 MG tablet Commonly known as: PAXIL Take 1 tablet (30 mg total) by mouth daily.   pravastatin 20 MG tablet Commonly known as: PRAVACHOL Take 1 tablet (20 mg total) by mouth daily.   Semaglutide(0.25 or 0.5MG /DOS) 2 MG/3ML Sopn Inject 0.25 mg into the skin once a week.   sildenafil 20 MG tablet Commonly known as: REVATIO Take 1-3 tablets (20-60 mg total) by mouth as needed.  Objective:   BP 104/72   Pulse 65   Ht 5\' 10"  (1.778 m)   Wt 200 lb (90.7 kg)   SpO2 97%   BMI 28.70 kg/m   Wt Readings from Last 3 Encounters:  09/12/22 200 lb (90.7 kg)  06/12/22 208 lb (94.3 kg)  02/22/22 213 lb (96.6 kg)    Physical Exam Vitals and nursing note reviewed.  Constitutional:      General: He is not in acute distress.    Appearance: He is well-developed. He is not diaphoretic.  Eyes:     General: No scleral icterus.    Conjunctiva/sclera: Conjunctivae normal.  Neck:     Thyroid: No thyromegaly.   Cardiovascular:     Rate and Rhythm: Normal rate and regular rhythm.     Heart sounds: Normal heart sounds. No murmur heard. Pulmonary:     Effort: Pulmonary effort is normal. No respiratory distress.     Breath sounds: Normal breath sounds. No wheezing.  Musculoskeletal:        General: No swelling. Normal range of motion.     Cervical back: Neck supple.  Lymphadenopathy:     Cervical: No cervical adenopathy.  Skin:    General: Skin is warm and dry.     Findings: No rash.  Neurological:     Mental Status: He is alert and oriented to person, place, and time.     Coordination: Coordination normal.  Psychiatric:        Behavior: Behavior normal.     A1c 6.7, better than last time.  Assessment & Plan:   Problem List Items Addressed This Visit       Cardiovascular and Mediastinum   Essential hypertension   Relevant Orders   CBC with Differential/Platelet   CMP14+EGFR   Lipid panel   Bayer DCA Hb A1c Waived     Endocrine   Hyperlipidemia associated with type 2 diabetes mellitus (HCC)   Relevant Medications   dapagliflozin propanediol (FARXIGA) 10 MG TABS tablet   Type 2 diabetes mellitus with other specified complication (HCC) - Primary   Relevant Medications   dapagliflozin propanediol (FARXIGA) 10 MG TABS tablet   Other Relevant Orders   CBC with Differential/Platelet   CMP14+EGFR   Lipid panel   Bayer DCA Hb A1c Waived  With A1c looking good and blood pressure looking good, not going to change anything.  He seems to be doing well.  Will await the rest of his blood work.  Follow up plan: Return in about 3 months (around 12/13/2022), or if symptoms worsen or fail to improve, for Diabetes recheck.  Counseling provided for all of the vaccine components Orders Placed This Encounter  Procedures   CBC with Differential/Platelet   CMP14+EGFR   Lipid panel   Bayer DCA Hb A1c Waived   HM DIABETES EYE EXAM    Arville Care, MD [facility] 09/12/2022, 3:31 PM

## 2022-10-22 ENCOUNTER — Telehealth: Payer: Self-pay

## 2022-10-22 NOTE — Telephone Encounter (Signed)
(  Key: [id number]) Ozempic 2mg   form thumbnail Your information has been submitted to [employer]. To check for an updated outcome later, reopen this PA request from your dashboard.  If [employer] has not responded to your request within 24 hours, contact [employer] at [phone]. If you think there may be a problem with your PA request, use our live chat feature at the bottom right.

## 2022-10-22 NOTE — Telephone Encounter (Signed)
Pharmacy Patient Advocate Encounter  Received notification from [facility] that Prior Authorization for Ozempic (0.25 or 0.5 MG/DOSE) 2MG /3ML pen-injectors has been DENIED.  See denial reason below. No denial letter attached in CMM. Will attache denial letter to Media tab once received.   PA #/Case ID/Reference #: [id number]

## 2022-10-22 NOTE — Telephone Encounter (Signed)
Pharmacy Patient Advocate Encounter  Received notification from [facility] that Prior Authorization for Ozempic (0.25 or 0.5 MG/DOSE) 2MG /3ML pen-injectors has been APPROVED from 10/22/22 to 10/21/25   PA #/Case ID/Reference #: [id number]

## 2022-10-22 NOTE — Telephone Encounter (Signed)
Resubmitted PA with lab results. New Key: [id number]

## 2022-10-31 ENCOUNTER — Ambulatory Visit (INDEPENDENT_AMBULATORY_CARE_PROVIDER_SITE_OTHER): Payer: BC Managed Care – PPO

## 2022-10-31 DIAGNOSIS — E1169 Type 2 diabetes mellitus with other specified complication: Secondary | ICD-10-CM

## 2022-10-31 LAB — HM DIABETES EYE EXAM

## 2022-10-31 NOTE — Progress Notes (Signed)
Alejandro Rivera arrived 10/31/2022 and has given verbal consent to obtain images and complete their overdue diabetic retinal screening.  The images have been sent to an ophthalmologist or optometrist for review and interpretation.  Results will be sent back to Dettinger, Elige Radon, MD for review.  Patient has been informed they will be contacted when we receive the results via telephone or MyChart

## 2022-11-12 ENCOUNTER — Other Ambulatory Visit (HOSPITAL_COMMUNITY): Payer: Self-pay

## 2022-12-13 ENCOUNTER — Ambulatory Visit: Payer: BC Managed Care – PPO | Admitting: Family Medicine

## 2022-12-25 ENCOUNTER — Ambulatory Visit: Payer: BC Managed Care – PPO | Admitting: Family Medicine

## 2022-12-25 ENCOUNTER — Encounter: Payer: Self-pay | Admitting: Family Medicine

## 2022-12-25 VITALS — BP 130/84 | HR 78 | Ht 70.0 in | Wt 206.0 lb

## 2022-12-25 DIAGNOSIS — Z23 Encounter for immunization: Secondary | ICD-10-CM | POA: Diagnosis not present

## 2022-12-25 DIAGNOSIS — I10 Essential (primary) hypertension: Secondary | ICD-10-CM

## 2022-12-25 DIAGNOSIS — E785 Hyperlipidemia, unspecified: Secondary | ICD-10-CM

## 2022-12-25 DIAGNOSIS — K219 Gastro-esophageal reflux disease without esophagitis: Secondary | ICD-10-CM

## 2022-12-25 DIAGNOSIS — E1169 Type 2 diabetes mellitus with other specified complication: Secondary | ICD-10-CM | POA: Diagnosis not present

## 2022-12-25 DIAGNOSIS — Z7984 Long term (current) use of oral hypoglycemic drugs: Secondary | ICD-10-CM

## 2022-12-25 LAB — BAYER DCA HB A1C WAIVED: HB A1C (BAYER DCA - WAIVED): 6.9 % — ABNORMAL HIGH (ref 4.8–5.6)

## 2022-12-25 NOTE — Progress Notes (Signed)
BP 130/84   Pulse 78   Ht 5\' 10"  (1.778 m)   Wt 206 lb (93.4 kg)   SpO2 96%   BMI 29.56 kg/m    Subjective:   Patient ID: Alejandro Rivera, male    DOB: Mar 19, 1965, 57 y.o.   MRN: [id number]  HPI: Alejandro Rivera is a 57 y.o. male presenting on 12/25/2022 for Medical Management of Chronic Issues and Diabetes   HPI Type 2 diabetes mellitus Patient comes in today for recheck of his diabetes. Patient has been currently taking Ozempic and metformin and Marcelline Deist but has been out of the [place] for a month.. Patient is currently on an ACE inhibitor/ARB. Patient has seen an ophthalmologist this year.  Patient admits to having a little bit of tingling in the toes of his right foot just starting some of the time not all the time. The symptom started onset as an adult hypertension and hyperlipidemia ARE RELATED TO DM   Hypertension Patient is currently on lisinopril hydrochlorothiazide, and their blood pressure today is 130/84. Patient denies any lightheadedness or dizziness. Patient denies headaches, blurred vision, chest pains, shortness of breath, or weakness. Denies any side effects from medication and is content with current medication.  Hyperlipidemia Patient is coming in for recheck of his hyperlipidemia. The patient is currently taking pravastatin. They deny any issues with myalgias or history of liver damage from it. They deny any focal numbness or weakness or chest pain.   GERD Patient is currently on omeprazole.  She denies any major symptoms or abdominal pain or belching or burping. She denies any blood in her stool or lightheadedness or dizziness.   Relevant past medical, surgical, family and social history reviewed and updated as indicated. Interim medical history since our last visit reviewed. Allergies and medications reviewed and updated.  Review of Systems  Constitutional:  Negative for chills and fever.  Eyes:  Negative for visual disturbance.  Respiratory:  Negative  for shortness of breath and wheezing.   Cardiovascular:  Negative for chest pain and leg swelling.  Musculoskeletal:  Negative for back pain and gait problem.  Skin:  Negative for rash.  Neurological:  Negative for dizziness, weakness and light-headedness.  All other systems reviewed and are negative.   Per HPI unless specifically indicated above   Allergies as of 12/25/2022   No Known Allergies      Medication List        Accurate as of December 25, 2022  8:36 AM. If you have any questions, ask your nurse or doctor.          STOP taking these medications    dapagliflozin propanediol 10 MG Tabs tablet Commonly known as: FARXIGA Stopped by: Elige Radon Thales Knipple   Fish Oil 1000 MG Caps Stopped by: Elige Radon Caelynn Marshman   multivitamin tablet Stopped by: Elige Radon Kataryna Mcquilkin       TAKE these medications    allopurinol 300 MG tablet Commonly known as: ZYLOPRIM Take 1 tablet (300 mg total) by mouth daily.   IBUPROFEN PO Take 200 mg by mouth daily as needed.   lisinopril-hydrochlorothiazide 20-25 MG tablet Commonly known as: ZESTORETIC Take 1 tablet by mouth daily.   metFORMIN 500 MG tablet Commonly known as: GLUCOPHAGE Take 2 tablets (1,000 mg total) by mouth 2 (two) times daily with a meal.   omeprazole 40 MG capsule Commonly known as: PRILOSEC Take 1 capsule (40 mg total) by mouth daily.   PARoxetine 30 MG tablet Commonly known as:  PAXIL Take 1 tablet (30 mg total) by mouth daily.   pravastatin 20 MG tablet Commonly known as: PRAVACHOL Take 1 tablet (20 mg total) by mouth daily.   Semaglutide(0.25 or 0.5MG /DOS) 2 MG/3ML Sopn Inject 0.25 mg into the skin once a week.   sildenafil 20 MG tablet Commonly known as: REVATIO Take 1-3 tablets (20-60 mg total) by mouth as needed.         Objective:   BP 130/84   Pulse 78   Ht 5\' 10"  (1.778 m)   Wt 206 lb (93.4 kg)   SpO2 96%   BMI 29.56 kg/m   Wt Readings from Last 3 Encounters:  12/25/22 206 lb  (93.4 kg)  09/12/22 200 lb (90.7 kg)  06/12/22 208 lb (94.3 kg)    Physical Exam Vitals and nursing note reviewed.  Constitutional:      General: He is not in acute distress.    Appearance: He is well-developed. He is not diaphoretic.  Eyes:     General: No scleral icterus.    Conjunctiva/sclera: Conjunctivae normal.  Neck:     Thyroid: No thyromegaly.  Cardiovascular:     Rate and Rhythm: Normal rate and regular rhythm.     Heart sounds: Normal heart sounds. No murmur heard. Pulmonary:     Effort: Pulmonary effort is normal. No respiratory distress.     Breath sounds: Normal breath sounds. No wheezing.  Musculoskeletal:        General: No swelling. Normal range of motion.     Cervical back: Neck supple.  Lymphadenopathy:     Cervical: No cervical adenopathy.  Skin:    General: Skin is warm and dry.     Findings: No rash.  Neurological:     Mental Status: He is alert and oriented to person, place, and time.     Coordination: Coordination normal.  Psychiatric:        Behavior: Behavior normal.     Results for orders placed or performed in visit on 11/19/22  HM DIABETES EYE EXAM  Result Value Ref Range   HM Diabetic Eye Exam No Retinopathy No Retinopathy    Assessment & Plan:   Problem List Items Addressed This Visit       Cardiovascular and Mediastinum   Essential hypertension     Digestive   GERD (gastroesophageal reflux disease)     Endocrine   Hyperlipidemia associated with type 2 diabetes mellitus (HCC)   Type 2 diabetes mellitus with other specified complication (HCC) - Primary   Relevant Orders   Bayer DCA Hb A1c Waived    A1c up slightly at 6.9 but still within range but trending up because has been off the [place], gave him a coupon card today and is going to try and get the [place] and if he cannot get it at the [place] 10 mg because of price then we may increase his Ozempic. Follow up plan: Return in about 3 months (around 03/27/2023), or if  symptoms worsen or fail to improve, for Diabetes.  Counseling provided for all of the vaccine components Orders Placed This Encounter  Procedures   Bayer DCA Hb A1c Waived    Arville Care, MD [facility] 12/25/2022, 8:36 AM

## 2023-02-22 ENCOUNTER — Other Ambulatory Visit: Payer: Self-pay | Admitting: Family Medicine

## 2023-02-22 DIAGNOSIS — K219 Gastro-esophageal reflux disease without esophagitis: Secondary | ICD-10-CM

## 2023-03-11 ENCOUNTER — Other Ambulatory Visit: Payer: Self-pay | Admitting: Family Medicine

## 2023-03-11 DIAGNOSIS — E1169 Type 2 diabetes mellitus with other specified complication: Secondary | ICD-10-CM

## 2023-04-02 ENCOUNTER — Ambulatory Visit: Payer: BC Managed Care – PPO | Admitting: Family Medicine

## 2023-04-04 ENCOUNTER — Encounter: Payer: Self-pay | Admitting: Family Medicine

## 2023-04-04 ENCOUNTER — Ambulatory Visit: Payer: Self-pay | Admitting: Family Medicine

## 2023-04-04 VITALS — BP 105/74 | HR 83 | Ht 70.0 in | Wt 202.0 lb

## 2023-04-04 DIAGNOSIS — Z7984 Long term (current) use of oral hypoglycemic drugs: Secondary | ICD-10-CM

## 2023-04-04 DIAGNOSIS — Z125 Encounter for screening for malignant neoplasm of prostate: Secondary | ICD-10-CM

## 2023-04-04 DIAGNOSIS — K219 Gastro-esophageal reflux disease without esophagitis: Secondary | ICD-10-CM

## 2023-04-04 DIAGNOSIS — Z23 Encounter for immunization: Secondary | ICD-10-CM

## 2023-04-04 DIAGNOSIS — E785 Hyperlipidemia, unspecified: Secondary | ICD-10-CM

## 2023-04-04 DIAGNOSIS — I10 Essential (primary) hypertension: Secondary | ICD-10-CM | POA: Diagnosis not present

## 2023-04-04 DIAGNOSIS — Z7985 Long-term (current) use of injectable non-insulin antidiabetic drugs: Secondary | ICD-10-CM

## 2023-04-04 DIAGNOSIS — E1169 Type 2 diabetes mellitus with other specified complication: Secondary | ICD-10-CM | POA: Diagnosis not present

## 2023-04-04 LAB — BAYER DCA HB A1C WAIVED: HB A1C (BAYER DCA - WAIVED): 6.5 % — ABNORMAL HIGH (ref 4.8–5.6)

## 2023-04-04 LAB — LIPID PANEL

## 2023-04-04 MED ORDER — METFORMIN HCL 500 MG PO TABS: 500.0000 mg | ORAL_TABLET | Freq: Every day | ORAL | 3 refills | Status: DC

## 2023-04-04 MED ORDER — LISINOPRIL-HYDROCHLOROTHIAZIDE 20-25 MG PO TABS: 1.0000 | ORAL_TABLET | Freq: Every day | ORAL | 3 refills | Status: DC

## 2023-04-04 MED ORDER — OZEMPIC (0.25 OR 0.5 MG/DOSE) 2 MG/3ML ~~LOC~~ SOPN: 0.2500 mg | PEN_INJECTOR | SUBCUTANEOUS | 3 refills | Status: DC

## 2023-04-04 MED ORDER — DAPAGLIFLOZIN PROPANEDIOL 10 MG PO TABS: 10.0000 mg | ORAL_TABLET | Freq: Every day | ORAL | 3 refills | Status: AC

## 2023-04-04 MED ORDER — PRAVASTATIN SODIUM 20 MG PO TABS: 20.0000 mg | ORAL_TABLET | Freq: Every day | ORAL | 3 refills | Status: DC

## 2023-04-04 MED ORDER — OMEPRAZOLE 40 MG PO CPDR: 40.0000 mg | DELAYED_RELEASE_CAPSULE | Freq: Every day | ORAL | 3 refills | Status: AC

## 2023-04-04 NOTE — Progress Notes (Signed)
 BP 105/74   Pulse 83   Ht 5\' 10"  (1.778 m)   Wt 202 lb (91.6 kg)   SpO2 98%   BMI 28.98 kg/m    Subjective:   Patient ID: Alejandro Rivera, male    DOB: 1965/02/17, 58 y.o.   MRN: [id number]  HPI: Alejandro Rivera is a 58 y.o. male presenting on 04/04/2023 for Medical Management of Chronic Issues, Diabetes, Hypertension, and Hyperlipidemia   HPI Type 2 diabetes mellitus Patient comes in today for recheck of his diabetes. Patient has been currently taking metformin and Ozempic. Patient is currently on an ACE inhibitor/ARB. Patient has not seen an ophthalmologist this year. Patient denies any new issues with their feet. The symptom started onset as an adult hypertension and hyperlipidemia ARE RELATED TO DM   Hypertension Patient is currently on lisinopril-hydrochlorothiazide, and their blood pressure today is 105/70. Patient denies any lightheadedness or dizziness. Patient denies headaches, blurred vision, chest pains, shortness of breath, or weakness. Denies any side effects from medication and is content with current medication.   Hyperlipidemia Patient is coming in for recheck of his hyperlipidemia. The patient is currently taking pravastatin. They deny any issues with myalgias or history of liver damage from it. They deny any focal numbness or weakness or chest pain.   GERD Patient is currently on omeprazole.  She denies any major symptoms or abdominal pain or belching or burping. She denies any blood in her stool or lightheadedness or dizziness.   Relevant past medical, surgical, family and social history reviewed and updated as indicated. Interim medical history since our last visit reviewed. Allergies and medications reviewed and updated.  Review of Systems  Constitutional:  Negative for chills and fever.  Eyes:  Negative for visual disturbance.  Respiratory:  Negative for shortness of breath and wheezing.   Cardiovascular:  Negative for chest pain and leg swelling.   Musculoskeletal:  Negative for back pain and gait problem.  Skin:  Negative for rash.  Neurological:  Negative for dizziness, weakness and light-headedness.  All other systems reviewed and are negative.   Per HPI unless specifically indicated above   Allergies as of 04/04/2023   No Known Allergies      Medication List        Accurate as of April 04, 2023  9:20 AM. If you have any questions, ask your nurse or doctor.          allopurinol 300 MG tablet Commonly known as: ZYLOPRIM Take 1 tablet (300 mg total) by mouth daily.   IBUPROFEN PO Take 200 mg by mouth daily as needed.   lisinopril-hydrochlorothiazide 20-25 MG tablet Commonly known as: ZESTORETIC Take 1 tablet by mouth daily.   metFORMIN 500 MG tablet Commonly known as: GLUCOPHAGE Take 1 tablet (500 mg total) by mouth daily with breakfast. What changed:  how much to take when to take this Changed by: Elige Radon Laquanda Bick   omeprazole 40 MG capsule Commonly known as: PRILOSEC Take 1 capsule (40 mg total) by mouth daily.   Ozempic (0.25 or 0.5 MG/DOSE) 2 MG/3ML Sopn Generic drug: Semaglutide(0.25 or 0.5MG /DOS) Inject 0.25 mg into the skin once a week. What changed: See the new instructions. Changed by: Elige Radon Janille Draughon   PARoxetine 30 MG tablet Commonly known as: PAXIL Take 1 tablet (30 mg total) by mouth daily.   pravastatin 20 MG tablet Commonly known as: PRAVACHOL Take 1 tablet (20 mg total) by mouth daily.   sildenafil 20 MG tablet Commonly  known as: REVATIO Take 1-3 tablets (20-60 mg total) by mouth as needed.         Objective:   BP 105/74   Pulse 83   Ht 5\' 10"  (1.778 m)   Wt 202 lb (91.6 kg)   SpO2 98%   BMI 28.98 kg/m   Wt Readings from Last 3 Encounters:  04/04/23 202 lb (91.6 kg)  12/25/22 206 lb (93.4 kg)  09/12/22 200 lb (90.7 kg)    Physical Exam Vitals and nursing note reviewed.  Constitutional:      General: He is not in acute distress.    Appearance: He is  well-developed. He is not diaphoretic.  Eyes:     General: No scleral icterus.       Right eye: No discharge.     Conjunctiva/sclera: Conjunctivae normal.     Pupils: Pupils are equal, round, and reactive to light.  Neck:     Thyroid: No thyromegaly.  Cardiovascular:     Rate and Rhythm: Normal rate and regular rhythm.     Heart sounds: Normal heart sounds. No murmur heard. Pulmonary:     Effort: Pulmonary effort is normal. No respiratory distress.     Breath sounds: Normal breath sounds. No wheezing.  Musculoskeletal:        General: Normal range of motion.     Cervical back: Neck supple.  Lymphadenopathy:     Cervical: No cervical adenopathy.  Skin:    General: Skin is warm and dry.     Findings: No rash.  Neurological:     Mental Status: He is alert and oriented to person, place, and time.     Coordination: Coordination normal.  Psychiatric:        Behavior: Behavior normal.       Assessment & Plan:   Problem List Items Addressed This Visit       Cardiovascular and Mediastinum   Essential hypertension   Relevant Medications   lisinopril-hydrochlorothiazide (ZESTORETIC) 20-25 MG tablet   pravastatin (PRAVACHOL) 20 MG tablet   Other Relevant Orders   CBC with Differential/Platelet   CMP14+EGFR   Lipid panel   Bayer DCA Hb A1c Waived     Digestive   GERD (gastroesophageal reflux disease)   Relevant Medications   omeprazole (PRILOSEC) 40 MG capsule     Endocrine   Hyperlipidemia associated with type 2 diabetes mellitus (HCC)   Relevant Medications   lisinopril-hydrochlorothiazide (ZESTORETIC) 20-25 MG tablet   metFORMIN (GLUCOPHAGE) 500 MG tablet   Semaglutide,0.25 or 0.5MG /DOS, (OZEMPIC, 0.25 OR 0.5 MG/DOSE,) 2 MG/3ML SOPN   pravastatin (PRAVACHOL) 20 MG tablet   Other Relevant Orders   CBC with Differential/Platelet   CMP14+EGFR   Lipid panel   Bayer DCA Hb A1c Waived   Type 2 diabetes mellitus with other specified complication (HCC) - Primary    Relevant Medications   lisinopril-hydrochlorothiazide (ZESTORETIC) 20-25 MG tablet   metFORMIN (GLUCOPHAGE) 500 MG tablet   Semaglutide,0.25 or 0.5MG /DOS, (OZEMPIC, 0.25 OR 0.5 MG/DOSE,) 2 MG/3ML SOPN   pravastatin (PRAVACHOL) 20 MG tablet   Other Relevant Orders   CBC with Differential/Platelet   CMP14+EGFR   Lipid panel   Bayer DCA Hb A1c Waived   Microalbumin / creatinine urine ratio   Other Visit Diagnoses       Prostate cancer screening       Relevant Orders   PSA, total and free       A1c looks good at 6.5.  He is having a little  bit of indigestion and belching with the metformin and Ozempic, we will back off on the metformin to help with symptoms and his A1c should still be good enough. Follow up plan: Return in about 3 months (around 07/02/2023), or if symptoms worsen or fail to improve, for Diabetes and hypertension recheck.  Counseling provided for all of the vaccine components Orders Placed This Encounter  Procedures   CBC with Differential/Platelet   CMP14+EGFR   Lipid panel   Bayer DCA Hb A1c Waived   PSA, total and free   Microalbumin / creatinine urine ratio    Arville Care, MD [place] [facility] 04/04/2023, 9:20 AM

## 2023-04-04 NOTE — Addendum Note (Signed)
 Addended by: Arville Care on: 04/04/2023 09:22 AM   Modules accepted: Orders

## 2023-04-05 LAB — LIPID PANEL
Chol/HDL Ratio: 4.2 ratio (ref 0.0–5.0)
Cholesterol, Total: 169 mg/dL (ref 100–199)
HDL: 40 mg/dL (ref >39)
LDL Chol Calc (NIH): 69 mg/dL (ref 0–99)
Triglycerides: 381 mg/dL — ABNORMAL HIGH (ref 0–149)
VLDL Cholesterol Cal: 60 mg/dL — ABNORMAL HIGH (ref 5–40)

## 2023-04-05 LAB — CMP14+EGFR
ALT: 18 IU/L (ref 0–44)
AST: 20 IU/L (ref 0–40)
Albumin: 4.6 g/dL (ref 3.8–4.9)
Alkaline Phosphatase: 97 IU/L (ref 44–121)
BUN/Creatinine Ratio: 17 (ref 9–20)
BUN: 20 mg/dL (ref 6–24)
Bilirubin Total: 0.6 mg/dL (ref 0.0–1.2)
CO2: 22 mmol/L (ref 20–29)
Calcium: 10.2 mg/dL (ref 8.7–10.2)
Chloride: 97 mmol/L (ref 96–106)
Creatinine, Ser: 1.2 mg/dL (ref 0.76–1.27)
Globulin, Total: 3 g/dL (ref 1.5–4.5)
Glucose: 131 mg/dL — ABNORMAL HIGH (ref 70–99)
Potassium: 4.7 mmol/L (ref 3.5–5.2)
Sodium: 136 mmol/L (ref 134–144)
Total Protein: 7.6 g/dL (ref 6.0–8.5)
eGFR: 70 mL/min/{1.73_m2} (ref >59)

## 2023-04-05 LAB — PSA, TOTAL AND FREE
PSA, Free Pct: 44.3 %
PSA, Free: 0.31 ng/mL
Prostate Specific Ag, Serum: 0.7 ng/mL (ref 0.0–4.0)

## 2023-04-05 LAB — CBC WITH DIFFERENTIAL/PLATELET
Basophils Absolute: 0.1 10*3/uL (ref 0.0–0.2)
Basos: 1 %
EOS (ABSOLUTE): 0.9 10*3/uL — ABNORMAL HIGH (ref 0.0–0.4)
Eos: 8 %
Hematocrit: 51.1 % — ABNORMAL HIGH (ref 37.5–51.0)
Hemoglobin: 17.2 g/dL (ref 13.0–17.7)
Immature Grans (Abs): 0 10*3/uL (ref 0.0–0.1)
Immature Granulocytes: 0 %
Lymphocytes Absolute: 1.9 10*3/uL (ref 0.7–3.1)
Lymphs: 17 %
MCH: 32.4 pg (ref 26.6–33.0)
MCHC: 33.7 g/dL (ref 31.5–35.7)
MCV: 96 fL (ref 79–97)
Monocytes Absolute: 0.9 10*3/uL (ref 0.1–0.9)
Monocytes: 8 %
Neutrophils Absolute: 7.2 10*3/uL — ABNORMAL HIGH (ref 1.4–7.0)
Neutrophils: 66 %
Platelets: 288 10*3/uL (ref 150–450)
RBC: 5.31 x10E6/uL (ref 4.14–5.80)
RDW: 12.7 % (ref 11.6–15.4)
WBC: 10.9 10*3/uL — ABNORMAL HIGH (ref 3.4–10.8)

## 2023-04-11 ENCOUNTER — Encounter: Payer: Self-pay | Admitting: Family Medicine

## 2023-06-20 ENCOUNTER — Other Ambulatory Visit: Payer: Self-pay | Admitting: Family Medicine

## 2023-06-20 DIAGNOSIS — F411 Generalized anxiety disorder: Secondary | ICD-10-CM

## 2023-07-04 ENCOUNTER — Encounter: Payer: Self-pay | Admitting: Family Medicine

## 2023-07-04 ENCOUNTER — Ambulatory Visit: Payer: 59 | Admitting: Family Medicine

## 2023-07-04 VITALS — BP 120/80 | HR 66 | Ht 70.0 in | Wt 199.0 lb

## 2023-07-04 DIAGNOSIS — I10 Essential (primary) hypertension: Secondary | ICD-10-CM | POA: Diagnosis not present

## 2023-07-04 DIAGNOSIS — Z7984 Long term (current) use of oral hypoglycemic drugs: Secondary | ICD-10-CM

## 2023-07-04 DIAGNOSIS — E785 Hyperlipidemia, unspecified: Secondary | ICD-10-CM | POA: Diagnosis not present

## 2023-07-04 DIAGNOSIS — E1169 Type 2 diabetes mellitus with other specified complication: Secondary | ICD-10-CM | POA: Diagnosis not present

## 2023-07-04 DIAGNOSIS — D72829 Elevated white blood cell count, unspecified: Secondary | ICD-10-CM | POA: Diagnosis not present

## 2023-07-04 LAB — CBC WITH DIFFERENTIAL/PLATELET
Basophils Absolute: 0.1 10*3/uL (ref 0.0–0.2)
Basos: 1 %
EOS (ABSOLUTE): 0.3 10*3/uL (ref 0.0–0.4)
Eos: 5 %
Hematocrit: 52.2 % — ABNORMAL HIGH (ref 37.5–51.0)
Hemoglobin: 17 g/dL (ref 13.0–17.7)
Immature Grans (Abs): 0 10*3/uL (ref 0.0–0.1)
Immature Granulocytes: 0 %
Lymphocytes Absolute: 2 10*3/uL (ref 0.7–3.1)
Lymphs: 29 %
MCH: 31.5 pg (ref 26.6–33.0)
MCHC: 32.6 g/dL (ref 31.5–35.7)
MCV: 97 fL (ref 79–97)
Monocytes Absolute: 0.7 10*3/uL (ref 0.1–0.9)
Monocytes: 10 %
Neutrophils Absolute: 3.9 10*3/uL (ref 1.4–7.0)
Neutrophils: 55 %
Platelets: 216 10*3/uL (ref 150–450)
RBC: 5.4 x10E6/uL (ref 4.14–5.80)
RDW: 12.9 % (ref 11.6–15.4)
WBC: 7 10*3/uL (ref 3.4–10.8)

## 2023-07-04 LAB — BAYER DCA HB A1C WAIVED: HB A1C (BAYER DCA - WAIVED): 7.2 % — ABNORMAL HIGH (ref 4.8–5.6)

## 2023-07-04 MED ORDER — OZEMPIC (0.25 OR 0.5 MG/DOSE) 2 MG/3ML ~~LOC~~ SOPN: 0.5000 mg | PEN_INJECTOR | SUBCUTANEOUS | 3 refills | Status: DC

## 2023-07-04 NOTE — Progress Notes (Signed)
 BP 120/80   Pulse 66   Ht 5\' 10"  (1.778 m)   Wt 199 lb (90.3 kg)   SpO2 98%   BMI 28.55 kg/m    Subjective:   Patient ID: Alejandro Rivera, male    DOB: 1965/03/14, 58 y.o.   MRN: [id number]  HPI: Alejandro Rivera is a 58 y.o. male presenting on 07/04/2023 for Medical Management of Chronic Issues, Diabetes, Anxiety, Shoulder Pain, and Elbow Pain   HPI Type 2 diabetes mellitus Patient comes in today for recheck of his diabetes. Patient has been currently taking Farxiga  and Ozempic , stopped the metformin . Patient is currently on an ACE inhibitor/ARB. Patient has not seen an ophthalmologist this year. Patient denies any new issues with their feet. The symptom started onset as an adult hypertension and hyperlipidemia ARE RELATED TO DM   Hypertension Patient is currently on lisinopril -hydrochlorothiazide , and their blood pressure today is 120/80. Patient denies any lightheadedness or dizziness. Patient denies headaches, blurred vision, chest pains, shortness of breath, or weakness. Denies any side effects from medication and is content with current medication.   Hyperlipidemia Patient is coming in for recheck of his hyperlipidemia. The patient is currently taking pravastatin . They deny any issues with myalgias or history of liver damage from it. They deny any focal numbness or weakness or chest pain.   Relevant past medical, surgical, family and social history reviewed and updated as indicated. Interim medical history since our last visit reviewed. Allergies and medications reviewed and updated.  Review of Systems  Constitutional:  Negative for chills and fever.  Eyes:  Negative for visual disturbance.  Respiratory:  Negative for shortness of breath and wheezing.   Cardiovascular:  Negative for chest pain and leg swelling.  Musculoskeletal:  Negative for back pain and gait problem.  Skin:  Negative for rash.  Neurological:  Negative for dizziness, weakness and light-headedness.   All other systems reviewed and are negative.   Per HPI unless specifically indicated above   Allergies as of 07/04/2023   No Known Allergies      Medication List        Accurate as of Jul 04, 2023  8:58 AM. If you have any questions, ask your nurse or doctor.          STOP taking these medications    metFORMIN  500 MG tablet Commonly known as: GLUCOPHAGE  Stopped by: Lucio Sabin [facility]       TAKE these medications    allopurinol  300 MG tablet Commonly known as: ZYLOPRIM  Take 1 tablet (300 mg total) by mouth daily.   dapagliflozin  propanediol 10 MG Tabs tablet Commonly known as: Farxiga  Take 1 tablet (10 mg total) by mouth daily before breakfast.   IBUPROFEN PO Take 200 mg by mouth daily as needed.   lisinopril -hydrochlorothiazide  20-25 MG tablet Commonly known as: ZESTORETIC  Take 1 tablet by mouth daily.   omeprazole  40 MG capsule Commonly known as: PRILOSEC Take 1 capsule (40 mg total) by mouth daily.   Ozempic  (0.25 or 0.5 MG/DOSE) 2 MG/3ML Sopn Generic drug: Semaglutide (0.25 or 0.5MG /DOS) Inject 0.5 mg into the skin once a week. What changed: how much to take Changed by: Lucio Sabin Reagan Klemz   PARoxetine  30 MG tablet Commonly known as: PAXIL  Take 1 tablet by mouth once daily   pravastatin  20 MG tablet Commonly known as: PRAVACHOL  Take 1 tablet (20 mg total) by mouth daily.   sildenafil  20 MG tablet Commonly known as: REVATIO  Take 1-3 tablets (20-60 mg total) by  mouth as needed.         Objective:   BP 120/80   Pulse 66   Ht 5\' 10"  (1.778 m)   Wt 199 lb (90.3 kg)   SpO2 98%   BMI 28.55 kg/m   Wt Readings from Last 3 Encounters:  07/04/23 199 lb (90.3 kg)  04/04/23 202 lb (91.6 kg)  12/25/22 206 lb (93.4 kg)    Physical Exam Vitals and nursing note reviewed.  Constitutional:      General: He is not in acute distress.    Appearance: He is well-developed. He is not diaphoretic.  Eyes:     General: No scleral icterus.     Conjunctiva/sclera: Conjunctivae normal.  Neck:     Thyroid: No thyromegaly.  Cardiovascular:     Rate and Rhythm: Normal rate and regular rhythm.     Heart sounds: Normal heart sounds. No murmur heard. Pulmonary:     Effort: Pulmonary effort is normal. No respiratory distress.     Breath sounds: Normal breath sounds. No wheezing.  Musculoskeletal:        General: Normal range of motion.     Cervical back: Neck supple.  Lymphadenopathy:     Cervical: No cervical adenopathy.  Skin:    General: Skin is warm and dry.     Findings: No rash.  Neurological:     Mental Status: He is alert and oriented to person, place, and time.     Coordination: Coordination normal.  Psychiatric:        Behavior: Behavior normal.       Assessment & Plan:   Problem List Items Addressed This Visit       Cardiovascular and Mediastinum   Essential hypertension     Endocrine   Hyperlipidemia associated with type 2 diabetes mellitus (HCC)   Relevant Medications   Semaglutide ,0.25 or 0.5MG /DOS, (OZEMPIC , 0.25 OR 0.5 MG/DOSE,) 2 MG/3ML SOPN   Type 2 diabetes mellitus with other specified complication (HCC)   Relevant Medications   Semaglutide ,0.25 or 0.5MG /DOS, (OZEMPIC , 0.25 OR 0.5 MG/DOSE,) 2 MG/3ML SOPN   Other Relevant Orders   Bayer DCA Hb A1c Waived   Microalbumin/Creatinine Ratio, Urine   Other Visit Diagnoses       Leukocytosis, unspecified type    -  Primary   Relevant Orders   CBC with Differential/Platelet       A1c up slightly at 7.2, will increase the Ozempic  just slightly and I think that should help Follow up plan: Return in about 3 months (around 10/04/2023), or if symptoms worsen or fail to improve, for Diabetes recheck.  Counseling provided for all of the vaccine components Orders Placed This Encounter  Procedures   CBC with Differential/Platelet   Bayer DCA Hb A1c Waived   Microalbumin/Creatinine Ratio, Urine    Jolyne Needs, MD [facility] 07/04/2023, 8:58 AM

## 2023-07-05 LAB — MICROALBUMIN / CREATININE URINE RATIO
Creatinine, Urine: 67.7 mg/dL
Microalb/Creat Ratio: 13 mg/g{creat} (ref 0–29)
Microalbumin, Urine: 8.5 ug/mL

## 2023-07-11 ENCOUNTER — Ambulatory Visit: Payer: Self-pay | Admitting: Family Medicine

## 2023-07-21 ENCOUNTER — Telehealth: Payer: Self-pay | Admitting: Family Medicine

## 2023-07-21 NOTE — Telephone Encounter (Signed)
 Informed pt that we do not have samples. Pt just used his last injection. Advised pt to keep an eye on his blood sugars, watch his diet and let us  know if his blood sugars get too high. Pt understood and had no further concerns.

## 2023-07-21 NOTE — Telephone Encounter (Signed)
 Copied from CRM [id number]. Topic: General - Other >> Jul 21, 2023 12:30 PM Turkey B wrote: Reason for CRM: pt called asking if he can get samples of Ozempic , because he can't get his refill unitl June 20 because of his insurance

## 2023-08-18 ENCOUNTER — Telehealth: Payer: Self-pay | Admitting: Family Medicine

## 2023-08-18 DIAGNOSIS — I10 Essential (primary) hypertension: Secondary | ICD-10-CM

## 2023-08-18 MED ORDER — LISINOPRIL-HYDROCHLOROTHIAZIDE 20-25 MG PO TABS: 1.0000 | ORAL_TABLET | Freq: Every day | ORAL | 3 refills | Status: AC

## 2023-08-18 NOTE — Telephone Encounter (Signed)
 Refills sent for pt. Informed pt to let pharmacy know what happened since insurance most likely will not cover a refill this soon. Pt verbalized understanding

## 2023-08-18 NOTE — Telephone Encounter (Signed)
 Copied from CRM [id number]. Topic: Clinical - Medication Question >> Aug 18, 2023  2:25 PM Alejandro Rivera wrote: Reason for CRM:  Alejandro Rivera has misplaced his lisinopril -hydrochlorothiazide  (ZESTORETIC ) 20-25 MG tablet and he only has 1 pill left. He would like for Dr. Maryanne to call him in another order for medication. Please call him to let him know what to do. His number [phone]

## 2023-09-13 ENCOUNTER — Other Ambulatory Visit: Payer: Self-pay | Admitting: Family Medicine

## 2023-09-13 DIAGNOSIS — F411 Generalized anxiety disorder: Secondary | ICD-10-CM

## 2023-09-27 ENCOUNTER — Other Ambulatory Visit: Payer: Self-pay | Admitting: Family Medicine

## 2023-10-10 ENCOUNTER — Encounter: Payer: Self-pay | Admitting: Family Medicine

## 2023-10-10 ENCOUNTER — Ambulatory Visit: Admitting: Family Medicine

## 2023-10-10 VITALS — BP 103/66 | HR 71 | Temp 97.8°F | Ht 70.0 in | Wt 201.0 lb

## 2023-10-10 DIAGNOSIS — M1A9XX0 Chronic gout, unspecified, without tophus (tophi): Secondary | ICD-10-CM

## 2023-10-10 DIAGNOSIS — F411 Generalized anxiety disorder: Secondary | ICD-10-CM

## 2023-10-10 DIAGNOSIS — E1169 Type 2 diabetes mellitus with other specified complication: Secondary | ICD-10-CM

## 2023-10-10 DIAGNOSIS — I10 Essential (primary) hypertension: Secondary | ICD-10-CM | POA: Diagnosis not present

## 2023-10-10 DIAGNOSIS — E785 Hyperlipidemia, unspecified: Secondary | ICD-10-CM

## 2023-10-10 LAB — BAYER DCA HB A1C WAIVED: HB A1C (BAYER DCA - WAIVED): 6.3 % — ABNORMAL HIGH (ref 4.8–5.6)

## 2023-10-10 LAB — LIPID PANEL

## 2023-10-10 MED ORDER — ALLOPURINOL 300 MG PO TABS: 300.0000 mg | ORAL_TABLET | Freq: Every day | ORAL | 3 refills | Status: AC

## 2023-10-10 MED ORDER — PAROXETINE HCL 30 MG PO TABS: 30.0000 mg | ORAL_TABLET | Freq: Every day | ORAL | 3 refills | Status: AC

## 2023-10-10 NOTE — Progress Notes (Signed)
 BP 103/66   Pulse 71   Temp 97.8 F (36.6 C) (Temporal)   Ht 5' 10 (1.778 m)   Wt 201 lb (91.2 kg)   SpO2 98%   BMI 28.84 kg/m    Subjective:   Patient ID: Alejandro Rivera, male    DOB: 1965-11-22, 58 y.o.   MRN: [id number]  HPI: Alejandro Rivera is a 58 y.o. male presenting on 10/10/2023 for Medical Management of Chronic Issues   Discussed the use of AI scribe software for clinical note transcription with the patient, who gave verbal consent to proceed.  History of Present Illness   Alejandro Rivera is a 58 year old male with diabetes who presents for a recheck of his blood sugar levels.  His blood sugar levels have been fluctuating between 120 and 160 mg/dL in the mornings. He continues to take Ozempic  and Farxiga  without any issues. His last A1c was 7.2%.  He is taking lisinopril  for hypertension, with recent blood pressure readings of 103/66 mmHg. No lightheadedness or dizziness.  He continues to take paroxetine  for anxiety and reports doing well without any anxiety symptoms.  He is on pravastatin  for cholesterol management and has not noticed any problems.  He takes omeprazole  for acid reflux, which controls his symptoms as long as he takes it regularly. He experiences mild indigestion if he misses a dose and has attempted to discontinue it without success.  He is on allopurinol  for gout prevention and has not experienced any gout flares in the past year.  He mentions tingling in his feet and has to be cautious about the shoes he wears, as tight shoes cause discomfort. He experiences some swelling in his feet during the day, which subsides at night.          Relevant past medical, surgical, family and social history reviewed and updated as indicated. Interim medical history since our last visit reviewed. Allergies and medications reviewed and updated.  Review of Systems  Constitutional:  Negative for chills and fever.  Eyes:  Negative for visual disturbance.   Respiratory:  Negative for shortness of breath and wheezing.   Cardiovascular:  Positive for leg swelling. Negative for chest pain.  Musculoskeletal:  Negative for back pain and gait problem.  Skin:  Negative for rash.  Neurological:  Negative for dizziness and light-headedness.  All other systems reviewed and are negative.   Per HPI unless specifically indicated above   Allergies as of 10/10/2023   No Known Allergies      Medication List        Accurate as of October 10, 2023  8:30 AM. If you have any questions, ask your nurse or doctor.          allopurinol  300 MG tablet Commonly known as: ZYLOPRIM  Take 1 tablet by mouth once daily   dapagliflozin  propanediol 10 MG Tabs tablet Commonly known as: Farxiga  Take 1 tablet (10 mg total) by mouth daily before breakfast.   IBUPROFEN PO Take 200 mg by mouth daily as needed.   lisinopril -hydrochlorothiazide  20-25 MG tablet Commonly known as: ZESTORETIC  Take 1 tablet by mouth daily.   omeprazole  40 MG capsule Commonly known as: PRILOSEC Take 1 capsule (40 mg total) by mouth daily.   Ozempic  (0.25 or 0.5 MG/DOSE) 2 MG/3ML Sopn Generic drug: Semaglutide (0.25 or 0.5MG /DOS) Inject 0.5 mg into the skin once a week.   PARoxetine  30 MG tablet Commonly known as: PAXIL  Take 1 tablet by mouth once daily   pravastatin   20 MG tablet Commonly known as: PRAVACHOL  Take 1 tablet (20 mg total) by mouth daily.   sildenafil  20 MG tablet Commonly known as: REVATIO  Take 1-3 tablets (20-60 mg total) by mouth as needed.         Objective:   BP 103/66   Pulse 71   Temp 97.8 F (36.6 C) (Temporal)   Ht 5' 10 (1.778 m)   Wt 201 lb (91.2 kg)   SpO2 98%   BMI 28.84 kg/m   Wt Readings from Last 3 Encounters:  10/10/23 201 lb (91.2 kg)  07/04/23 199 lb (90.3 kg)  04/04/23 202 lb (91.6 kg)    Physical Exam Vitals and nursing note reviewed.  Constitutional:      General: He is not in acute distress.    Appearance: He is  well-developed. He is not diaphoretic.  Eyes:     General: No scleral icterus.       Right eye: No discharge.     Conjunctiva/sclera: Conjunctivae normal.  Neck:     Thyroid: No thyromegaly.  Cardiovascular:     Rate and Rhythm: Normal rate and regular rhythm.     Heart sounds: Normal heart sounds. No murmur heard. Pulmonary:     Effort: Pulmonary effort is normal. No respiratory distress.     Breath sounds: Normal breath sounds. No wheezing.  Musculoskeletal:        General: No swelling. Normal range of motion.     Cervical back: Neck supple.  Lymphadenopathy:     Cervical: No cervical adenopathy.  Skin:    General: Skin is warm and dry.     Findings: No rash.  Neurological:     Mental Status: He is alert and oriented to person, place, and time.     Coordination: Coordination normal.  Psychiatric:        Behavior: Behavior normal.    Physical Exam   VITALS: BP- 103/66 NECK: Thyroid without masses. CHEST: Lungs clear to auscultation bilaterally. CARDIOVASCULAR: Heart regular rate and rhythm, no murmurs. EXTREMITIES: No edema in legs, good pulses.         Assessment & Plan:   Problem List Items Addressed This Visit       Cardiovascular and Mediastinum   Essential hypertension   Relevant Orders   Bayer DCA Hb A1c Waived   CBC with Differential/Platelet   CMP14+EGFR   Lipid panel     Endocrine   Hyperlipidemia associated with type 2 diabetes mellitus (HCC)   Relevant Orders   Bayer DCA Hb A1c Waived   CBC with Differential/Platelet   CMP14+EGFR   Lipid panel   Type 2 diabetes mellitus with other specified complication (HCC) - Primary   Relevant Orders   Bayer DCA Hb A1c Waived   CBC with Differential/Platelet   CMP14+EGFR   Lipid panel     Other   GAD (generalized anxiety disorder)   Gout       Type 2 diabetes mellitus Blood glucose levels between 120-160 mg/dL. Awaiting A1c results, previously 7.2%. - Obtain A1c test and call with results. -  Continue Ozempic  and Farxiga .  Essential hypertension Blood pressure controlled at 103/66 mmHg on lisinopril . - Continue lisinopril .  Chronic gout No recent flares. On allopurinol  for prevention. - Continue allopurinol . - Maintain hydration and dietary modifications.  Generalized anxiety disorder Anxiety managed on paroxetine . - Continue paroxetine .  Gastroesophageal reflux disease Symptoms controlled with omeprazole . Recurrence on discontinuation. - Continue omeprazole .  Hyperlipidemia On pravastatin . Awaiting lipid levels. -  Continue pravastatin . - Obtain blood work for lipid levels.  Follow-Up Follow-up in 3 months. - Call with lab results.          Follow up plan: Return in about 3 months (around 01/10/2024), or if symptoms worsen or fail to improve, for Diabetes recheck.  Counseling provided for all of the vaccine components Orders Placed This Encounter  Procedures   Bayer DCA Hb A1c Waived   CBC with Differential/Platelet   CMP14+EGFR   Lipid panel    Fonda Levins, MD [facility] 10/10/2023, 8:30 AM

## 2023-10-11 LAB — CBC WITH DIFFERENTIAL/PLATELET
Basophils Absolute: 0 x10E3/uL (ref 0.0–0.2)
Basos: 0 %
EOS (ABSOLUTE): 0.2 x10E3/uL (ref 0.0–0.4)
Eos: 2 %
Hematocrit: 48.2 % (ref 37.5–51.0)
Hemoglobin: 16.3 g/dL (ref 13.0–17.7)
Immature Grans (Abs): 0 x10E3/uL (ref 0.0–0.1)
Immature Granulocytes: 0 %
Lymphocytes Absolute: 1 x10E3/uL (ref 0.7–3.1)
Lymphs: 11 %
MCH: 32.3 pg (ref 26.6–33.0)
MCHC: 33.8 g/dL (ref 31.5–35.7)
MCV: 96 fL (ref 79–97)
Monocytes Absolute: 0.8 x10E3/uL (ref 0.1–0.9)
Monocytes: 9 %
Neutrophils Absolute: 7.1 x10E3/uL — ABNORMAL HIGH (ref 1.4–7.0)
Neutrophils: 78 %
Platelets: 215 x10E3/uL (ref 150–450)
RBC: 5.04 x10E6/uL (ref 4.14–5.80)
RDW: 13.1 % (ref 11.6–15.4)
WBC: 9.2 x10E3/uL (ref 3.4–10.8)

## 2023-10-11 LAB — LIPID PANEL
Chol/HDL Ratio: 3.5 ratio (ref 0.0–5.0)
Cholesterol, Total: 148 mg/dL (ref 100–199)
HDL: 42 mg/dL (ref >39)
LDL Chol Calc (NIH): 65 mg/dL (ref 0–99)
Triglycerides: 252 mg/dL — ABNORMAL HIGH (ref 0–149)
VLDL Cholesterol Cal: 41 mg/dL — ABNORMAL HIGH (ref 5–40)

## 2023-10-11 LAB — CMP14+EGFR
ALT: 17 IU/L (ref 0–44)
AST: 20 IU/L (ref 0–40)
Albumin: 4.5 g/dL (ref 3.8–4.9)
Alkaline Phosphatase: 101 IU/L (ref 44–121)
BUN/Creatinine Ratio: 16 (ref 9–20)
BUN: 20 mg/dL (ref 6–24)
Bilirubin Total: 0.9 mg/dL (ref 0.0–1.2)
CO2: 21 mmol/L (ref 20–29)
Calcium: 10 mg/dL (ref 8.7–10.2)
Chloride: 98 mmol/L (ref 96–106)
Creatinine, Ser: 1.25 mg/dL (ref 0.76–1.27)
Globulin, Total: 2.7 g/dL (ref 1.5–4.5)
Glucose: 117 mg/dL — ABNORMAL HIGH (ref 70–99)
Potassium: 4.8 mmol/L (ref 3.5–5.2)
Sodium: 138 mmol/L (ref 134–144)
Total Protein: 7.2 g/dL (ref 6.0–8.5)
eGFR: 66 mL/min/1.73 (ref >59)

## 2023-10-22 ENCOUNTER — Ambulatory Visit: Payer: Self-pay | Admitting: Family Medicine

## 2024-01-16 ENCOUNTER — Ambulatory Visit: Payer: Self-pay | Admitting: Family Medicine

## 2024-01-16 ENCOUNTER — Encounter: Payer: Self-pay | Admitting: Family Medicine

## 2024-01-16 VITALS — BP 103/73 | HR 64 | Wt 198.0 lb

## 2024-01-16 DIAGNOSIS — E1169 Type 2 diabetes mellitus with other specified complication: Secondary | ICD-10-CM

## 2024-01-16 DIAGNOSIS — Z23 Encounter for immunization: Secondary | ICD-10-CM

## 2024-01-16 DIAGNOSIS — E1159 Type 2 diabetes mellitus with other circulatory complications: Secondary | ICD-10-CM

## 2024-01-16 LAB — BAYER DCA HB A1C WAIVED: HB A1C (BAYER DCA - WAIVED): 6.6 % — ABNORMAL HIGH (ref 4.8–5.6)

## 2024-01-16 MED ORDER — PRAVASTATIN SODIUM 20 MG PO TABS: 20.0000 mg | ORAL_TABLET | Freq: Every day | ORAL | 3 refills | Status: AC

## 2024-01-16 NOTE — Progress Notes (Signed)
 BP 103/73   Pulse 64   Wt 198 lb (89.8 kg)   SpO2 94%   BMI 28.41 kg/m    Subjective:   Patient ID: Alejandro Rivera, male    DOB: [birth year], 58 y.o.   MRN: [id number]  HPI: [name] is a 58 y.o. male presenting on 01/16/2024 for Medical Management of Chronic Issues   Discussed the use of AI scribe software for clinical note transcription with the patient, who gave verbal consent to proceed.  History of Present Illness   Alejandro Rivera is a 58 year old male with diabetes who presents for a routine follow-up visit.  Peripheral neuropathy symptoms - Tingling sensation around the toes, most prominent around the big toe - Sensation is less pronounced on the other toes - No numbness reported  Diabetes mellitus management - Currently managed with Ozempic  and Farxiga  - Most recent hemoglobin A1c is 6.6, slightly increased from previous 6.5 - Perceives blood sugar control as 'not too bad'  Cholesterol management - Currently taking pravastatin  - No adverse effects from pravastatin   Cardiovascular and autonomic symptoms - No lightheadedness or dizziness - Blood pressure today is 108/73          Relevant past medical, surgical, family and social history reviewed and updated as indicated. Interim medical history since our last visit reviewed. Allergies and medications reviewed and updated.  Review of Systems  Constitutional:  Negative for chills and fever.  Eyes:  Negative for visual disturbance.  Respiratory:  Negative for shortness of breath and wheezing.   Cardiovascular:  Negative for chest pain and leg swelling.  Musculoskeletal:  Negative for back pain and gait problem.  Skin:  Negative for rash.  Neurological:  Positive for numbness. Negative for dizziness and light-headedness.  All other systems reviewed and are negative.   Per HPI unless specifically indicated above   Allergies as of 01/16/2024   No Known Allergies      Medication List         Accurate as of January 16, 2024 10:49 AM. If you have any questions, ask your nurse or doctor.          allopurinol  300 MG tablet Commonly known as: ZYLOPRIM  Take 1 tablet (300 mg total) by mouth daily.   dapagliflozin  propanediol 10 MG Tabs tablet Commonly known as: Farxiga  Take 1 tablet (10 mg total) by mouth daily before breakfast.   IBUPROFEN PO Take 200 mg by mouth daily as needed.   lisinopril -hydrochlorothiazide  20-25 MG tablet Commonly known as: ZESTORETIC  Take 1 tablet by mouth daily.   omeprazole  40 MG capsule Commonly known as: PRILOSEC Take 1 capsule (40 mg total) by mouth daily.   Ozempic  (0.25 or 0.5 MG/DOSE) 2 MG/3ML Sopn Generic drug: Semaglutide (0.25 or 0.5MG /DOS) Inject 0.5 mg into the skin once a week.   PARoxetine  30 MG tablet Commonly known as: PAXIL  Take 1 tablet (30 mg total) by mouth daily.   pravastatin  20 MG tablet Commonly known as: PRAVACHOL  Take 1 tablet (20 mg total) by mouth daily.   sildenafil  20 MG tablet Commonly known as: REVATIO  Take 1-3 tablets (20-60 mg total) by mouth as needed.         Objective:   BP 103/73   Pulse 64   Wt 198 lb (89.8 kg)   SpO2 94%   BMI 28.41 kg/m   Wt Readings from Last 3 Encounters:  01/16/24 198 lb (89.8 kg)  10/10/23 201 lb (91.2 kg)  07/04/23 199 lb (  90.3 kg)    Physical Exam Physical Exam   VITALS: BP- 108/73 NECK: Thyroid without lumps or nodules. CHEST: Lungs clear to auscultation bilaterally. CARDIOVASCULAR: Regular heart sounds, no murmurs. ABDOMEN: Abdomen non-tender to palpation. EXTREMITIES: No cuts, sores, or swelling. Good pulses.       Diabetic Foot Exam - Simple   Simple Foot Form Diabetic Foot exam was performed with the following findings: Yes 01/16/2024 10:50 AM  Visual Inspection No deformities, no ulcerations, no other skin breakdown bilaterally: Yes Sensation Testing See comments: Yes Pulse Check Posterior Tibialis and Dorsalis pulse intact  bilaterally: Yes Comments Diminished sensation on toes of his right foot but everywhere else is intact      Assessment & Plan:   Problem List Items Addressed This Visit       Cardiovascular and Mediastinum   Hypertension associated with diabetes (HCC)   Relevant Medications   pravastatin  (PRAVACHOL ) 20 MG tablet     Endocrine   Hyperlipidemia associated with type 2 diabetes mellitus (HCC)   Relevant Medications   pravastatin  (PRAVACHOL ) 20 MG tablet   Type 2 diabetes mellitus with other specified complication (HCC) - Primary   Relevant Medications   pravastatin  (PRAVACHOL ) 20 MG tablet   Other Relevant Orders   Bayer DCA Hb A1c Waived       Type 2 diabetes mellitus with peripheral neuropathy A1c at 6.6, well-controlled. Discussed neuropathy risks. - Continue Ozempic  and Farxiga . - Monitor feet for cuts or sores, seek care if infection signs appear.  Hypertension Well-controlled, BP 108/73, asymptomatic. - Continue current medication regimen. - Monitor for lightheadedness or dizziness.  Hyperlipidemia Managed with pravastatin . - Continue pravastatin .          Follow up plan: Return in about 3 months (around 04/15/2024), or if symptoms worsen or fail to improve, for Diabetes.  Counseling provided for all of the vaccine components Orders Placed This Encounter  Procedures   Bayer DCA Hb A1c Waived    Fonda Levins, MD [facility] 01/16/2024, 10:49 AM

## 2024-01-19 ENCOUNTER — Ambulatory Visit: Payer: Self-pay | Admitting: Family Medicine

## 2024-03-18 ENCOUNTER — Other Ambulatory Visit: Payer: Self-pay | Admitting: Family Medicine

## 2024-03-19 ENCOUNTER — Telehealth: Payer: Self-pay | Admitting: Family Medicine

## 2024-03-19 NOTE — Telephone Encounter (Unsigned)
 Copied from CRM [id number]. Topic: Clinical - Prescription Issue >> Mar 19, 2024 12:04 PM Graeme ORN wrote: Reason for CRM: Patient called. Would like to speak to Dr. Maryanne. Nurse about medication price Semaglutide ,0.25 or 0.5MG /DOS, (OZEMPIC , 0.25 OR 0.5 MG/DOSE,) 2 MG/3ML SOPN. Jan $49.99, now its $149.99. Would like to see if there was any assistance because he is not able to afford it. None left next dose due Monday.

## 2024-04-15 ENCOUNTER — Ambulatory Visit: Admitting: Family Medicine
